# Patient Record
Sex: Female | Born: 1995 | Race: White | Hispanic: No | Marital: Single | Smoking: Former smoker
Health system: Southern US, Community
[De-identification: ages and names within clinical notes are randomized; demographics above are authoritative.]

## PROBLEM LIST (undated history)

## (undated) DIAGNOSIS — K219 Gastro-esophageal reflux disease without esophagitis: Secondary | ICD-10-CM

## (undated) DIAGNOSIS — Z23 Encounter for immunization: Secondary | ICD-10-CM

## (undated) DIAGNOSIS — T7840XA Allergy, unspecified, initial encounter: Secondary | ICD-10-CM

## (undated) DIAGNOSIS — IMO0001 Reserved for inherently not codable concepts without codable children: Secondary | ICD-10-CM

## (undated) DIAGNOSIS — N946 Dysmenorrhea, unspecified: Secondary | ICD-10-CM

## (undated) HISTORY — DX: Reserved for inherently not codable concepts without codable children: IMO0001

## (undated) HISTORY — PX: NO PAST SURGERIES: SHX2092

## (undated) HISTORY — DX: Dysmenorrhea, unspecified: N94.6

## (undated) HISTORY — DX: Encounter for immunization: Z23

## (undated) HISTORY — DX: Gastro-esophageal reflux disease without esophagitis: K21.9

## (undated) HISTORY — DX: Allergy, unspecified, initial encounter: T78.40XA

---
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
## 2016-07-01 ENCOUNTER — Encounter: Payer: Self-pay | Admitting: Physician Assistant

## 2016-07-01 ENCOUNTER — Ambulatory Visit (INDEPENDENT_AMBULATORY_CARE_PROVIDER_SITE_OTHER): Payer: BLUE CROSS/BLUE SHIELD | Admitting: Physician Assistant

## 2016-07-01 VITALS — BP 104/62 | HR 60 | Temp 97.3°F | Resp 16 | Wt 124.0 lb

## 2016-07-01 DIAGNOSIS — N3090 Cystitis, unspecified without hematuria: Secondary | ICD-10-CM | POA: Diagnosis not present

## 2016-07-01 LAB — POCT URINALYSIS DIPSTICK
Bilirubin, UA: NEGATIVE
Blood, UA: NEGATIVE
Glucose, UA: NEGATIVE
Ketones, UA: NEGATIVE
Leukocytes, UA: NEGATIVE
Urobilinogen, UA: 0.2 E.U./dL

## 2016-07-01 MED ORDER — CIPROFLOXACIN HCL 500 MG PO TABS: 500.0000 mg | ORAL_TABLET | Freq: Two times a day (BID) | ORAL | 0 refills | Status: AC

## 2016-07-01 NOTE — Progress Notes (Signed)
[name]  Chief Complaint  Patient presents with  . Urinary Tract Infection    Started last week    Subjective:    Patient ID: Melissa Barr, female    DOB: 01/26/1996, 21 y.o.   MRN: [id number]   Urinary Tract Infection: Patient with a history of pyelnonephritis complains of burning with urination, dysuria, frequency and nocturia She has had symptoms for 1 week. Patient also complains of back pain. Patient denies headache, stomach ache and vaginal discharge. Patient does have a history of recurrent UTI.  Patient does have a history of pyelonephritis or other renal issues. Patient denies vaginal discharge and denies new sexual partners. The patient denies recent travel outside of the [place]. Does have concerns of STI which she is getting evaluated at the health department tomorrow.  Review of Systems  Constitutional: Positive for malaise/fatigue. Negative for chills, diaphoresis, fever and weight loss.  Gastrointestinal: Negative.   Genitourinary: Positive for dysuria, flank pain, frequency and urgency. Negative for hematuria.  Neurological: Negative for dizziness, weakness and headaches.       Objective:   BP 104/62 (BP Location: Left Arm, Patient Position: Sitting, Cuff Size: Normal)   Pulse 60   Temp 97.3 F (36.3 C) (Oral)   Resp 16   Wt 124 lb (56.2 kg)   Patient Active Problem List   Diagnosis Date Noted  . Concussion 06/13/2015  . Implanon in place 12/14/2014    Outpatient Encounter Prescriptions as of 07/01/2016  Medication Sig Note  . escitalopram (LEXAPRO) 10 MG tablet Take 1/2 tab PO q h.s. X 1 week then 1 tab PO q h.s.   . etonogestrel (NEXPLANON) 68 MG IMPL implant 1 each by Subdermal route once.   Marland Kitchen ibuprofen (ADVIL,MOTRIN) 400 MG tablet Take 1 tablet (400 mg total) by mouth every 6 (six) hours as needed.   . methocarbamol (ROBAXIN) 500 MG tablet  06/13/2015: Received from: External Pharmacy  . triamcinolone  ointment (KENALOG) 0.1 % Reported on 05/08/2015 12/14/2014: Received from: External Pharmacy  . ciprofloxacin (CIPRO) 500 MG tablet Take 1 tablet (500 mg total) by mouth 2 (two) times daily.   . [DISCONTINUED] predniSONE (DELTASONE) 50 MG tablet  06/09/2015: Received from: External Pharmacy   No facility-administered encounter medications on file as of 07/01/2016.     Allergies  Allergen Reactions  . Sulfa Antibiotics Shortness Of Breath    rash       Physical Exam  Constitutional: She is oriented to person, place, and time. She appears well-developed and well-nourished. No distress.  Cardiovascular: Normal rate and regular rhythm.   Pulmonary/Chest: Effort normal and breath sounds normal.  Abdominal: Soft. Bowel sounds are normal. She exhibits no distension. There is tenderness in the suprapubic area. There is CVA tenderness. There is no rebound and no guarding.  Neurological: She is alert and oriented to person, place, and time.  Skin: Skin is warm and dry. She is not diaphoretic.  Psychiatric: She has a normal mood and affect. Her behavior is normal.       Assessment & Plan:  1. Cystitis   In office UA difficult to read 2/2 AZO tablets. S/sx consistent with cystitis, does have hx of pyelonephritis.   - ciprofloxacin (CIPRO) 500 MG tablet; Take 1 tablet (500 mg total) by mouth 2 (two) times daily.  Dispense: 14 tablet; Refill: 0  Return if symptoms worsen or fail to improve.  The entirety of the information documented in the History of  Present Illness, Review of Systems and Physical Exam were personally obtained by me. Portions of this information were initially documented by Kavin LeechLaura Walsh, CMA and reviewed by me for thoroughness and accuracy.    Patient Instructions  Urinary Tract Infection, Adult A urinary tract infection (UTI) is an infection of any part of the urinary tract, which includes the kidneys, ureters, bladder, and urethra. These organs make, store, and get rid of  urine in the body. UTI can be a bladder infection (cystitis) or kidney infection (pyelonephritis). What are the causes? This infection may be caused by fungi, viruses, or bacteria. Bacteria are the most common cause of UTIs. This condition can also be caused by repeated incomplete emptying of the bladder during urination. What increases the risk? This condition is more likely to develop if:  You ignore your need to urinate or hold urine for long periods of time.  You do not empty your bladder completely during urination.  You wipe back to front after urinating or having a bowel movement, if you are female.  You are uncircumcised, if you are female.  You are constipated.  You have a urinary catheter that stays in place (indwelling).  You have a weak defense (immune) system.  You have a medical condition that affects your bowels, kidneys, or bladder.  You have diabetes.  You take antibiotic medicines frequently or for long periods of time, and the antibiotics no longer work well against certain types of infections (antibiotic resistance).  You take medicines that irritate your urinary tract.  You are exposed to chemicals that irritate your urinary tract.  You are female. What are the signs or symptoms? Symptoms of this condition include:  Fever.  Frequent urination or passing small amounts of urine frequently.  Needing to urinate urgently.  Pain or burning with urination.  Urine that smells bad or unusual.  Cloudy urine.  Pain in the lower abdomen or back.  Trouble urinating.  Blood in the urine.  Vomiting or being less hungry than normal.  Diarrhea or abdominal pain.  Vaginal discharge, if you are female. How is this diagnosed? This condition is diagnosed with a medical history and physical exam. You will also need to provide a urine sample to test your urine. Other tests may be done, including:  Blood tests.  Sexually transmitted disease (STD) testing. If  you have had more than one UTI, a cystoscopy or imaging studies may be done to determine the cause of the infections. How is this treated? Treatment for this condition often includes a combination of two or more of the following:  Antibiotic medicine.  Other medicines to treat less common causes of UTI.  Over-the-counter medicines to treat pain.  Drinking enough water to stay hydrated. Follow these instructions at home:  Take over-the-counter and prescription medicines only as told by your health care provider.  If you were prescribed an antibiotic, take it as told by your health care provider. Do not stop taking the antibiotic even if you start to feel better.  Avoid alcohol, caffeine, tea, and carbonated beverages. They can irritate your bladder.  Drink enough fluid to keep your urine clear or pale yellow.  Keep all follow-up visits as told by your health care provider. This is important.  Make sure to:  Empty your bladder often and completely. Do not hold urine for long periods of time.  Empty your bladder before and after sex.  Wipe from front to back after a bowel movement if you are female.  Use each tissue one time when you wipe. Contact a health care provider if:  You have back pain.  You have a fever.  You feel nauseous or vomit.  Your symptoms do not get better after 3 days.  Your symptoms go away and then return. Get help right away if:  You have severe back pain or lower abdominal pain.  You are vomiting and cannot keep down any medicines or water. This information is not intended to replace advice given to you by your health care provider. Make sure you discuss any questions you have with your health care provider. Document Released: 11/14/2004 Document Revised: 07/19/2015 Document Reviewed: 12/26/2014 Elsevier Interactive Patient Education  2017 [employer].

## 2016-07-01 NOTE — Patient Instructions (Signed)

## 2016-07-01 NOTE — Addendum Note (Signed)
Addended by: Kavin LeechWALSH, Afsa Meany E on: 07/01/2016 04:43 PM   Modules accepted: Orders

## 2016-07-03 LAB — URINE CULTURE

## 2016-07-03 LAB — PLEASE NOTE

## 2016-07-04 ENCOUNTER — Telehealth: Payer: Self-pay

## 2016-07-04 NOTE — Telephone Encounter (Signed)
Advised pt of lab results. Pt verbally acknowledges understanding. Emily Drozdowski, CMA   

## 2016-07-04 NOTE — Telephone Encounter (Signed)
-----   Message from Trey SailorsAdriana M Pollak, [place]PA-C sent at 07/03/2016  3:35 PM EDT ----- Urine culture is positive for E. Coli sensitive to ciprofloxacin. Hopefully pt starting to feel better.

## 2016-09-26 ENCOUNTER — Ambulatory Visit: Payer: BLUE CROSS/BLUE SHIELD | Admitting: Physician Assistant

## 2016-09-27 ENCOUNTER — Encounter: Payer: Self-pay | Admitting: Physician Assistant

## 2016-09-27 ENCOUNTER — Ambulatory Visit (INDEPENDENT_AMBULATORY_CARE_PROVIDER_SITE_OTHER): Payer: BLUE CROSS/BLUE SHIELD | Admitting: Physician Assistant

## 2016-09-27 VITALS — BP 102/64 | HR 92 | Temp 98.6°F | Resp 16 | Wt 127.0 lb

## 2016-09-27 DIAGNOSIS — J302 Other seasonal allergic rhinitis: Secondary | ICD-10-CM | POA: Diagnosis not present

## 2016-09-27 DIAGNOSIS — R062 Wheezing: Secondary | ICD-10-CM

## 2016-09-27 DIAGNOSIS — R0981 Nasal congestion: Secondary | ICD-10-CM | POA: Diagnosis not present

## 2016-09-27 MED ORDER — BENZONATATE 100 MG PO CAPS: 100.0000 mg | ORAL_CAPSULE | Freq: Three times a day (TID) | ORAL | 0 refills | Status: AC | PRN

## 2016-09-27 MED ORDER — ALBUTEROL SULFATE HFA 108 (90 BASE) MCG/ACT IN AERS: 2.0000 | INHALATION_SPRAY | Freq: Four times a day (QID) | RESPIRATORY_TRACT | 2 refills | Status: DC | PRN

## 2016-09-27 MED ORDER — FLUTICASONE PROPIONATE 50 MCG/ACT NA SUSP: 2.0000 | Freq: Every day | NASAL | 6 refills | Status: DC

## 2016-09-27 NOTE — Patient Instructions (Signed)
Allergic Rhinitis Allergic rhinitis is when the mucous membranes in the nose respond to allergens. Allergens are particles in the air that cause your body to have an allergic reaction. This causes you to release allergic antibodies. Through a chain of events, these eventually cause you to release histamine into the blood stream. Although meant to protect the body, it is this release of histamine that causes your discomfort, such as frequent sneezing, congestion, and an itchy, runny nose. What are the causes? Seasonal allergic rhinitis (hay fever) is caused by pollen allergens that may come from grasses, trees, and weeds. Year-round allergic rhinitis (perennial allergic rhinitis) is caused by allergens such as house dust mites, pet dander, and mold spores. What are the signs or symptoms?  Nasal stuffiness (congestion).  Itchy, runny nose with sneezing and tearing of the eyes. How is this diagnosed? Your health care provider can help you determine the allergen or allergens that trigger your symptoms. If you and your health care provider are unable to determine the allergen, skin or blood testing may be used. Your health care provider will diagnose your condition after taking your health history and performing a physical exam. Your health care provider may assess you for other related conditions, such as asthma, pink eye, or an ear infection. How is this treated? Allergic rhinitis does not have a cure, but it can be controlled by:  Medicines that block allergy symptoms. These may include allergy shots, nasal sprays, and oral antihistamines.  Avoiding the allergen. Hay fever may often be treated with antihistamines in pill or nasal spray forms. Antihistamines block the effects of histamine. There are over-the-counter medicines that may help with nasal congestion and swelling around the eyes. Check with your health care provider before taking or giving this medicine. If avoiding the allergen or the  medicine prescribed do not work, there are many new medicines your health care provider can prescribe. Stronger medicine may be used if initial measures are ineffective. Desensitizing injections can be used if medicine and avoidance does not work. Desensitization is when a patient is given ongoing shots until the body becomes less sensitive to the allergen. Make sure you follow up with your health care provider if problems continue. Follow these instructions at home: It is not possible to completely avoid allergens, but you can reduce your symptoms by taking steps to limit your exposure to them. It helps to know exactly what you are allergic to so that you can avoid your specific triggers. Contact a health care provider if:  You have a fever.  You develop a cough that does not stop easily (persistent).  You have shortness of breath.  You start wheezing.  Symptoms interfere with normal daily activities. This information is not intended to replace advice given to you by your health care provider. Make sure you discuss any questions you have with your health care provider. Document Released: 10/30/2000 Document Revised: 10/06/2015 Document Reviewed: 10/12/2012 Elsevier Interactive Patient Education  2017 Elsevier Inc.  

## 2016-09-27 NOTE — Progress Notes (Signed)
Patient: Melissa Barr Female    DOB: [birth year]   21 y.o.   MRN: [id number] Visit Date: 09/27/2016  Today's Provider: Trey Sailors, PA-C   Chief Complaint  Patient presents with  . Cough    Started in June   Subjective:    Cough  This is a chronic problem. The current episode started more than 1 month ago. The problem has been gradually worsening. The cough is productive of sputum. Associated symptoms include rhinorrhea and wheezing. Pertinent negatives include no ear pain, eye redness, headaches, postnasal drip, sore throat or shortness of breath. Nothing aggravates the symptoms. Risk factors for lung disease include smoking/tobacco exposure. She has tried OTC cough suppressant (Mucinex, Alleragra) for the symptoms. The treatment provided no relief. Her past medical history is significant for environmental allergies.       Allergies  Allergen Reactions  . Sulfa Antibiotics Shortness Of Breath    rash     Current Outpatient Prescriptions:  .  escitalopram (LEXAPRO) 10 MG tablet, Take 1/2 tab PO q h.s. X 1 week then 1 tab PO q h.s., Disp: 30 tablet, Rfl: 1 .  etonogestrel (NEXPLANON) 68 MG IMPL implant, 1 each by Subdermal route once., Disp: , Rfl:  .  ibuprofen (ADVIL,MOTRIN) 400 MG tablet, Take 1 tablet (400 mg total) by mouth every 6 (six) hours as needed., Disp: 30 tablet, Rfl: 0 .  triamcinolone ointment (KENALOG) 0.1 %, Reported on 05/08/2015, Disp: , Rfl:   Review of Systems  Constitutional: Negative.   HENT: Positive for congestion, rhinorrhea, tinnitus and voice change. Negative for ear discharge, ear pain, hearing loss, nosebleeds, postnasal drip, sinus pain, sinus pressure, sneezing, sore throat and trouble swallowing.   Eyes: Positive for discharge. Negative for photophobia, pain, redness, itching and visual disturbance.  Respiratory: Positive for cough and wheezing. Negative for apnea, choking, chest tightness, shortness of breath and stridor.     Gastrointestinal: Negative for abdominal distention, abdominal pain, anal bleeding, blood in stool, constipation, diarrhea, nausea, rectal pain and vomiting.  Allergic/Immunologic: Positive for environmental allergies.  Neurological: Positive for dizziness. Negative for light-headedness and headaches.    Social History  Substance Use Topics  . Smoking status: Former Smoker    Years: 1.00    Types: Cigarettes    Quit date: 04/18/2016  . Smokeless tobacco: Never Used  . Alcohol use Yes     Comment: Ocassional    Objective:   BP 102/64 (BP Location: Right Arm, Patient Position: Sitting, Cuff Size: Normal)   Pulse 92   Temp 98.6 F (37 C) (Oral)   Resp 16   Wt 127 lb (57.6 kg)   SpO2 98%  Vitals:   09/27/16 1440  BP: 102/64  Pulse: 92  Resp: 16  Temp: 98.6 F (37 C)  TempSrc: Oral  SpO2: 98%  Weight: 127 lb (57.6 kg)     Physical Exam  Constitutional: She appears well-developed and well-nourished.  HENT:  Right Ear: Tympanic membrane and external ear normal.  Left Ear: Tympanic membrane and external ear normal.  Nose: Right sinus exhibits no maxillary sinus tenderness and no frontal sinus tenderness. Left sinus exhibits no maxillary sinus tenderness and no frontal sinus tenderness.  Mouth/Throat: Oropharynx is clear and moist. No oropharyngeal exudate.  Sounds like patient has a lot of nasal congestion.  Eyes: Conjunctivae are normal.  Neck: Neck supple.  Cardiovascular: Normal rate and regular rhythm.   Pulmonary/Chest: Effort normal and breath sounds normal.  Lymphadenopathy:    She has no cervical adenopathy.  Skin: Skin is warm and dry.  Psychiatric: She has a normal mood and affect. Her behavior is normal.        Assessment & Plan:     1. Nasal congestion  - fluticasone (FLONASE) 50 MCG/ACT nasal spray; Place 2 sprays into both nostrils daily.  Dispense: 16 g; Refill: 6 - benzonatate (TESSALON PERLES) 100 MG capsule; Take 1 capsule (100 mg total) by mouth  3 (three) times daily as needed for cough.  Dispense: 21 capsule; Refill: 0  2. Wheezing  - albuterol (PROVENTIL HFA;VENTOLIN HFA) 108 (90 Base) MCG/ACT inhaler; Inhale 2 puffs into the lungs every 6 (six) hours as needed for wheezing or shortness of breath.  Dispense: 1 Inhaler; Refill: 2  3. Seasonal allergic rhinitis, unspecified trigger  Takes allegra daily.  No Follow-up on file.  The entirety of the information documented in the History of Present Illness, Review of Systems and Physical Exam were personally obtained by me. Portions of this information were initially documented by Kavin LeechLaura Walsh, CMA and reviewed by me for thoroughness and accuracy.         Trey SailorsAdriana M Pollak, PA-C  [facility]

## 2016-12-26 ENCOUNTER — Ambulatory Visit (INDEPENDENT_AMBULATORY_CARE_PROVIDER_SITE_OTHER): Payer: BLUE CROSS/BLUE SHIELD | Admitting: Physician Assistant

## 2016-12-26 ENCOUNTER — Encounter: Payer: Self-pay | Admitting: Physician Assistant

## 2016-12-26 VITALS — BP 110/60 | HR 100 | Temp 98.2°F | Resp 16 | Ht 62.0 in | Wt 124.0 lb

## 2016-12-26 DIAGNOSIS — N3090 Cystitis, unspecified without hematuria: Secondary | ICD-10-CM | POA: Diagnosis not present

## 2016-12-26 DIAGNOSIS — R197 Diarrhea, unspecified: Secondary | ICD-10-CM | POA: Diagnosis not present

## 2016-12-26 DIAGNOSIS — R309 Painful micturition, unspecified: Secondary | ICD-10-CM | POA: Diagnosis not present

## 2016-12-26 LAB — POCT URINALYSIS DIPSTICK
BILIRUBIN UA: NEGATIVE
GLUCOSE UA: NEGATIVE
KETONES UA: NEGATIVE
NITRITE UA: NEGATIVE
Protein, UA: NEGATIVE
Spec Grav, UA: 1.015 (ref 1.010–1.025)
Urobilinogen, UA: 0.2 E.U./dL
pH, UA: 6 (ref 5.0–8.0)

## 2016-12-26 MED ORDER — CIPROFLOXACIN HCL 500 MG PO TABS: 500.0000 mg | ORAL_TABLET | Freq: Two times a day (BID) | ORAL | 0 refills | Status: DC

## 2016-12-26 NOTE — Patient Instructions (Signed)
Food Choices to Help Relieve Diarrhea, Adult When you have diarrhea, the foods you eat and your eating habits are very important. Choosing the right foods and drinks can help:  Relieve diarrhea.  Replace lost fluids and nutrients.  Prevent dehydration.  What general guidelines should I follow? Relieving diarrhea  Choose foods with less than 2 g or .07 oz. of fiber per serving.  Limit fats to less than 8 tsp (38 g or 1.34 oz.) a day.  Avoid the following: ? Foods and beverages sweetened with high-fructose corn syrup, honey, or sugar alcohols such as xylitol, sorbitol, and mannitol. ? Foods that contain a lot of fat or sugar. ? Fried, greasy, or spicy foods. ? High-fiber grains, breads, and cereals. ? Raw fruits and vegetables.  Eat foods that are rich in probiotics. These foods include dairy products such as yogurt and fermented milk products. They help increase healthy bacteria in the stomach and intestines (gastrointestinal tract, or GI tract).  If you have lactose intolerance, avoid dairy products. These may make your diarrhea worse.  Take medicine to help stop diarrhea (antidiarrheal medicine) only as told by your health care provider. Replacing nutrients  Eat small meals or snacks every 3-4 hours.  Eat bland foods, such as white rice, toast, or baked potato, until your diarrhea starts to get better. Gradually reintroduce nutrient-rich foods as tolerated or as told by your health care provider. This includes: ? Well-cooked protein foods. ? Peeled, seeded, and soft-cooked fruits and vegetables. ? Low-fat dairy products.  Take vitamin and mineral supplements as told by your health care provider. Preventing dehydration   Start by sipping water or a special solution to prevent dehydration (oral rehydration solution, ORS). Urine that is clear or pale yellow means that you are getting enough fluid.  Try to drink at least 8-10 cups of fluid each day to help replace lost  fluids.  You may add other liquids in addition to water, such as clear juice or decaffeinated sports drinks, as tolerated or as told by your health care provider.  Avoid drinks with caffeine, such as coffee, tea, or soft drinks.  Avoid alcohol. What foods are recommended? The items listed may not be a complete list. Talk with your health care provider about what dietary choices are best for you. Grains White rice. White, French, or pita breads (fresh or toasted), including plain rolls, buns, or bagels. White pasta. Saltine, soda, or graham crackers. Pretzels. Low-fiber cereal. Cooked cereals made with water (such as cornmeal, farina, or cream cereals). Plain muffins. Matzo. Melba toast. Zwieback. Vegetables Potatoes (without the skin). Most well-cooked and canned vegetables without skins or seeds. Tender lettuce. Fruits Apple sauce. Fruits canned in juice. Cooked apricots, cherries, grapefruit, peaches, pears, or plums. Fresh bananas and cantaloupe. Meats and other protein foods Baked or boiled chicken. Eggs. Tofu. Fish. Seafood. Smooth nut butters. Ground or well-cooked tender beef, ham, veal, lamb, pork, or poultry. Dairy Plain yogurt, kefir, and unsweetened liquid yogurt. Lactose-free milk, buttermilk, skim milk, or soy milk. Low-fat or nonfat hard cheese. Beverages Water. Low-calorie sports drinks. Fruit juices without pulp. Strained tomato and vegetable juices. Decaffeinated teas. Sugar-free beverages not sweetened with sugar alcohols. Oral rehydration solutions, if approved by your health care provider. Seasoning and other foods Bouillon, broth, or soups made from recommended foods. What foods are not recommended? The items listed may not be a complete list. Talk with your health care provider about what dietary choices are best for you. Grains Whole grain, whole wheat,   bran, or rye breads, rolls, pastas, and crackers. Wild or brown rice. Whole grain or bran cereals. Barley. Oats and  oatmeal. Corn tortillas or taco shells. Granola. Popcorn. Vegetables Raw vegetables. Fried vegetables. Cabbage, broccoli, Brussels sprouts, artichokes, baked beans, beet greens, corn, kale, legumes, peas, sweet potatoes, and yams. Potato skins. Cooked spinach and cabbage. Fruits Dried fruit, including raisins and dates. Raw fruits. Stewed or dried prunes. Canned fruits with syrup. Meat and other protein foods Fried or fatty meats. Deli meats. Chunky nut butters. Nuts and seeds. Beans and lentils. Tomasa BlaseBacon. Hot dogs. Sausage. Dairy High-fat cheeses. Whole milk, chocolate milk, and beverages made with milk, such as milk shakes. Half-and-half. Cream. sour cream. Ice cream. Beverages Caffeinated beverages (such as coffee, tea, soda, or energy drinks). Alcoholic beverages. Fruit juices with pulp. Prune juice. Soft drinks sweetened with high-fructose corn syrup or sugar alcohols. High-calorie sports drinks. Fats and oils Butter. Cream sauces. Margarine. Salad oils. Plain salad dressings. Olives. Avocados. Mayonnaise. Sweets and desserts Sweet rolls, doughnuts, and sweet breads. Sugar-free desserts sweetened with sugar alcohols such as xylitol and sorbitol. Seasoning and other foods Honey. Hot sauce. Chili powder. Gravy. Cream-based or milk-based soups. Pancakes and waffles. Summary  When you have diarrhea, the foods you eat and your eating habits are very important.  Make sure you get at least 8-10 cups of fluid each day, or enough to keep your urine clear or pale yellow.  Eat bland foods and gradually reintroduce healthy, nutrient-rich foods as tolerated, or as told by your health care provider.  Avoid high-fiber, fried, greasy, or spicy foods. This information is not intended to replace advice given to you by your health care provider. Make sure you discuss any questions you have with your health care provider. Document Released: 04/27/2003 Document Revised: 02/02/2016 Document Reviewed:  02/02/2016 Elsevier Interactive Patient Education  2017 Elsevier Inc. Urinary Tract Infection, Adult A urinary tract infection (UTI) is an infection of any part of the urinary tract. The urinary tract includes the:  Kidneys.  Ureters.  Bladder.  Urethra.  These organs make, store, and get rid of pee (urine) in the body. Follow these instructions at home:  Take over-the-counter and prescription medicines only as told by your doctor.  If you were prescribed an antibiotic medicine, take it as told by your doctor. Do not stop taking the antibiotic even if you start to feel better.  Avoid the following drinks: ? Alcohol. ? Caffeine. ? Tea. ? Carbonated drinks.  Drink enough fluid to keep your pee clear or pale yellow.  Keep all follow-up visits as told by your doctor. This is important.  Make sure to: ? Empty your bladder often and completely. Do not to hold pee for long periods of time. ? Empty your bladder before and after sex. ? Wipe from front to back after a bowel movement if you are female. Use each tissue one time when you wipe. Contact a doctor if:  You have back pain.  You have a fever.  You feel sick to your stomach (nauseous).  You throw up (vomit).  Your symptoms do not get better after 3 days.  Your symptoms go away and then come back. Get help right away if:  You have very bad back pain.  You have very bad lower belly (abdominal) pain.  You are throwing up and cannot keep down any medicines or water. This information is not intended to replace advice given to you by your health care provider. Make sure you discuss  any questions you have with your health care provider. Document Released: 07/24/2007 Document Revised: 07/13/2015 Document Reviewed: 12/26/2014 Elsevier Interactive Patient Education  [employer]2018 Elsevier Inc.

## 2016-12-26 NOTE — Progress Notes (Signed)
Patient: Melissa Barr Female    DOB: Feb 26, 1995   21 y.o.   MRN: [id number] Visit Date: 12/26/2016  Today's Provider: Margaretann LovelessJennifer M Laurajean Hosek, PA-C   Chief Complaint  Patient presents with  . Urinary Tract Infection   Subjective:    HPI Urinary Tract Infection: Patient complains of nausea and pain in the epigastrium She has had symptoms for 1 day. Patient also complains of back pain. Patient denies fever. Patient does have a history of recurrent UTI.  Patient does have a history of pyelonephritis. Patient reports that she has been sick for several weeks with a viral stomach infection. Patient reports having abdominal pain, nausea and diarrhea for several weeks. Patient reports that she had black watery stools for several days approx 3 weeks ago. Stools then returned to normal and now is having loose yellow stools that started yesterday. Patient reports that she did take OTC Pepcid and Pepto to help with pain and diarrhea.  Patient reports she did start her period 2 days ago. Patient reports not taking any medications for pain.   Patient C/O of knot on left wrist x's one month. Patient reports pain on left wrist. Patient reports not taking any medications for wrist pain.     Allergies  Allergen Reactions  . Sulfa Antibiotics Shortness Of Breath    rash     Current Outpatient Medications:  .  albuterol (PROVENTIL HFA;VENTOLIN HFA) 108 (90 Base) MCG/ACT inhaler, Inhale 2 puffs into the lungs every 6 (six) hours as needed for wheezing or shortness of breath., Disp: 1 Inhaler, Rfl: 2 .  etonogestrel (NEXPLANON) 68 MG IMPL implant, 1 each by Subdermal route once., Disp: , Rfl:  .  ibuprofen (ADVIL,MOTRIN) 400 MG tablet, Take 1 tablet (400 mg total) by mouth every 6 (six) hours as needed., Disp: 30 tablet, Rfl: 0  Review of Systems  Constitutional: Negative.   Respiratory: Negative.   Cardiovascular: Negative.   Gastrointestinal: Positive for abdominal distention, abdominal pain,  diarrhea and nausea. Negative for anal bleeding, blood in stool, constipation and rectal pain.  Genitourinary: Positive for dysuria, flank pain, frequency and vaginal bleeding (on menstrual). Negative for hematuria, menstrual problem, pelvic pain, vaginal discharge and vaginal pain.  Musculoskeletal: Positive for back pain.  Neurological: Negative.     Social History   Tobacco Use  . Smoking status: Former Smoker    Years: 1.00    Types: Cigarettes    Last attempt to quit: 04/18/2016    Years since quitting: 0.6  . Smokeless tobacco: Never Used  Substance Use Topics  . Alcohol use: Yes    Comment: Ocassional    Objective:   BP 110/60 (BP Location: Left Arm, Patient Position: Sitting, Cuff Size: Normal)   Pulse 100   Temp 98.2 F (36.8 C) (Oral)   Resp 16   Ht 5\' 2"  (1.575 m)   Wt 124 lb (56.2 kg)   BMI 22.68 kg/m  Vitals:   12/26/16 1129  BP: 110/60  Pulse: 100  Resp: 16  Temp: 98.2 F (36.8 C)  TempSrc: Oral  Weight: 124 lb (56.2 kg)  Height: 5\' 2"  (1.575 m)     Physical Exam  Constitutional: She is oriented to person, place, and time. She appears well-developed and well-nourished. No distress.  Cardiovascular: Normal rate, regular rhythm and normal heart sounds. Exam reveals no gallop and no friction rub.  No murmur heard. Pulmonary/Chest: Effort normal and breath sounds normal. No respiratory distress. She has  no wheezes. She has no rales.  Abdominal: Soft. Normal appearance and bowel sounds are normal. She exhibits no distension and no mass. There is no hepatosplenomegaly. There is generalized tenderness. There is no rebound, no guarding and no CVA tenderness.  Neurological: She is alert and oriented to person, place, and time.  Skin: Skin is warm and dry. She is not diaphoretic.       Assessment & Plan:     1. Cystitis Worsening symptoms. UA positive. Will treat empirically with Cipro. Continue to push fluids. Urine sent for culture. Will follow up pending  C&S results. She is to call if symptoms do not improve or if they worsen.  - Urine Culture - ciprofloxacin (CIPRO) 500 MG tablet; Take 1 tablet (500 mg total) 2 (two) times daily by mouth.  Dispense: 10 tablet; Refill: 0  2. Painful urination See above medical treatment plan. - POCT urinalysis dipstick  3. Diarrhea, unspecified type Recurrent. Only happens once daily, but not every day. Was given information for food choices to lessen diarrhea. May use imodium if it becomes more frequent. Call if symptoms worsen and will evaluate further.        Margaretann LovelessJennifer M Keandrea Tapley, PA-C  [facility]

## 2016-12-28 LAB — URINE CULTURE
MICRO NUMBER:: 81258436
SPECIMEN QUALITY:: ADEQUATE

## 2016-12-30 ENCOUNTER — Telehealth: Payer: Self-pay

## 2016-12-30 NOTE — Telephone Encounter (Signed)
Patient advised as directed below.  Thanks,  -Joseline 

## 2016-12-30 NOTE — Telephone Encounter (Signed)
-----   Message from Margaretann LovelessJennifer M Burnette, PA-C sent at 12/30/2016 11:06 AM EST ----- Urine culture positive for e. Coli bacteria. It is susceptible to cipro so take until completed and call if symptoms worsen.

## 2017-03-11 ENCOUNTER — Ambulatory Visit (INDEPENDENT_AMBULATORY_CARE_PROVIDER_SITE_OTHER): Payer: BLUE CROSS/BLUE SHIELD | Admitting: Certified Nurse Midwife

## 2017-03-11 ENCOUNTER — Encounter: Payer: Self-pay | Admitting: Certified Nurse Midwife

## 2017-03-11 VITALS — BP 100/60 | HR 78 | Ht 62.0 in | Wt 132.0 lb

## 2017-03-11 DIAGNOSIS — Z3046 Encounter for surveillance of implantable subdermal contraceptive: Secondary | ICD-10-CM

## 2017-03-11 DIAGNOSIS — Z30016 Encounter for initial prescription of transdermal patch hormonal contraceptive device: Secondary | ICD-10-CM | POA: Diagnosis not present

## 2017-03-11 DIAGNOSIS — Z01419 Encounter for gynecological examination (general) (routine) without abnormal findings: Secondary | ICD-10-CM

## 2017-03-11 DIAGNOSIS — Z113 Encounter for screening for infections with a predominantly sexual mode of transmission: Secondary | ICD-10-CM | POA: Diagnosis not present

## 2017-03-11 MED ORDER — NORELGESTROMIN-ETH ESTRADIOL 150-35 MCG/24HR TD PTWK: MEDICATED_PATCH | TRANSDERMAL | 12 refills | Status: DC

## 2017-03-11 NOTE — Progress Notes (Signed)
Gynecology Annual Exam  PCP: [name], PA-C  Chief Complaint:  Chief Complaint  Patient presents with  . Gynecologic Exam    nexplanon removal    History of Present Illness:Melissa Barr presents today for her annual exam. She is a 22 year old Caucasian/White female, G0 P0000, whose LMP was 02/24/2017. She is also wanting her expiring Nexplanon removed today.  Her menses are regular. They occur every every month. Some last 5-7 days and others last 2+weeks, are light to moderate flow, and are without clots.  She has  Dysmenorrhea when her menses first start. She takes Midol with relief. She is debating whether to have another Nexplanon inserted or to switch methods because of her current bleeding pattern and because of her 18# weight gain since it's insertion.   The patient's past medical history is non-contributory. She has completed her Gardasil series.  Since her last annual GYN exam 10/03/2015, she has had no significant changes in her health history.  She is sexually active and reports no problems with intercourse. She is currently using Nexplanon for contraception. It was inserted 04/18/2014.  Her most recent Pap smear: NA due to age.  There is no family history of breast cancer The patient does do occ self breast exams.  The patient had stopped smoking in 2018 and restarted smoking 2 mos ago: 3 cigarettes/day. The patient does drink infrequently.  The patient does exercise occasionally. She has not had cholesterol screening and is not interested in testing.   Review of Systems: Review of Systems  Constitutional: Negative for chills, fever and weight loss.  HENT: Negative for congestion, sinus pain and sore throat.   Eyes: Negative for blurred vision and pain.  Respiratory: Negative for hemoptysis, shortness of breath and wheezing.   Cardiovascular: Negative for chest pain, palpitations and leg swelling.  Gastrointestinal: Negative for abdominal pain, blood in  stool, diarrhea, heartburn, nausea and vomiting.  Genitourinary: Negative for dysuria, frequency, hematuria and urgency.       Positive for prolonged bleeding and dysmenorrhea  Musculoskeletal: Negative for back pain, joint pain and myalgias.  Skin: Negative for itching and rash.  Neurological: Negative for dizziness, tingling and headaches.  Endo/Heme/Allergies: Negative for environmental allergies and polydipsia. Does not bruise/bleed easily.       Negative for hirsutism   Psychiatric/Behavioral: Negative for depression. The patient is not nervous/anxious and does not have insomnia.     Past Medical History:  Past Medical History:  Diagnosis Date  . Allergy   . Dysmenorrhea   . Esophageal reflux   . Human papilloma virus (HPV) type 9 vaccine administered    gardisil series complete    Past Surgical History:  Past Surgical History:  Procedure Laterality Date  . NO PAST SURGERIES      Family History:  Family History  Problem Relation Age of Onset  . Thyroid cancer Mother   . Hyperlipidemia Father   . Heart disease Maternal Grandmother   . Hypertension Maternal Grandmother   . Bladder Cancer Maternal Grandfather 42  . Heart disease Maternal Grandfather   . Hypertension Maternal Grandfather   . Heart disease Paternal Grandfather   . Breast cancer Maternal Aunt 35  . Colon cancer Maternal Uncle 50  . Uterine cancer Other 69    Social History:  Social History   Socioeconomic History  . Marital status: Single    Spouse name: Not on file  . Number of children: 0  . Years of  education: Not on file  . Highest education level: Not on file  Social Needs  . Financial resource strain: Not on file  . Food insecurity - worry: Not on file  . Food insecurity - inability: Not on file  . Transportation needs - medical: Not on file  . Transportation needs - non-medical: Not on file  Occupational History  . Not on file  Tobacco Use  . Smoking status: Light Tobacco Smoker     Packs/day: 0.10    Years: 1.50    Pack years: 0.15    Types: Cigarettes    Last attempt to quit: 04/18/2016    Years since quitting: 0.8  . Smokeless tobacco: Never Used  . Tobacco comment: had stopped smoking 04/18/2016 after 1 yr and restarted 12/2016  Substance and Sexual Activity  . Alcohol use: Yes    Comment: Ocassional   . Drug use: No  . Sexual activity: Yes    Partners: Male    Birth control/protection: Implant  Other Topics Concern  . Not on file  Social History Narrative  . Not on file    Allergies:  Allergies  Allergen Reactions  . Sulfa Antibiotics Shortness Of Breath    rash    Medications:  Current Outpatient Medications on File Prior to Visit  Medication Sig Dispense Refill  . etonogestrel (NEXPLANON) 68 MG IMPL implant 1 each by Subdermal route once.     No current facility-administered medications on file prior to visit.   Physical Exam Vitals: BP 100/60   Pulse 78   Ht 5\' 2"  (1.575 m)   Wt 132 lb (59.9 kg)   LMP 02/24/2017 (Approximate)   BMI 24.14 kg/m   General: WF in NAD HEENT: normocephalic, anicteric Neck: no thyroid enlargement, no palpable nodules, no cervical lymphadenopathy  Pulmonary: No increased work of breathing, CTAB Cardiovascular: RRR, without murmur  Breast: Breast symmetrical, no tenderness, no palpable nodules or masses, no skin or nipple retraction present, no nipple discharge.  No axillary, infraclavicular or supraclavicular lymphadenopathy. Abdomen: Soft, non-tender, non-distended.  Umbilicus without lesions.  No hepatomegaly or masses palpable. No evidence of hernia. Genitourinary:  External: Normal external female genitalia.  Normal urethral meatus, normal Bartholin's and Skene's glands.    Vagina: Normal vaginal mucosa, no evidence of prolapse.    Cervix: Grossly normal in appearance, no bleeding, non-tender  Uterus: normal size, shape, and consistency, mobile, and non-tender  Adnexa: No adnexal masses,  non-tender  Rectal: deferred  Lymphatic: no evidence of inguinal lymphadenopathy Extremities: no edema, erythema, or tenderness. Nexplanon palpated in left upper arm. Neurologic: Grossly intact Psychiatric: mood appropriate, affect full     Assessment: 22 y.o. annual gyn exam Tobacco use Contraceptive management  Plan:   1) Breast cancer screening - recommend monthly self breast exam.   2) STI screening was offered and accepted.  3) Cervical cancer screening - Pap not indicated. ASCCP guidelines and rational discussed. Start Pap smears in 1 year  4) Contraception - Education given regarding options for contraception after Nexplanon removed. Discussed inserting another Nexplanon vs using the patch or Nuvaring, OCPs. Patient desires to try contraceptive patch. We reviewed how to apply, changing weekly x 3 weeks then going patchless in the 4th week. Explained possible side effects and risks of thromboembolic disease. Follow up in 2-3 months. Patient applied first patch prior to leaving office. Rx sent to pharmacy. Advised to use back up birth control (condoms) x 1 week.     GYNECOLOGY PROCEDURE NOTE  Nexplanon  removal discussed in detail.  Risks of infection, bleeding, nerve injury all reviewed.  Patient understands risks and desires to proceed.  Verbal consent obtained.  Patient is certain she wants the Nexplanon  removed.  All questions answered.  Procedure: Patient placed in dorsal supine with left arm above head, elbow flexed at 90 degrees, arm resting on examination table.  Nexplanon identified without problems.  Betadine scrub x3.  1 ml of 1% lidocaine injected under distal end of Nexplanon device without problems.  Sterile gloves applied.  Small 0.5cm incision made at distal tip of  device with 11 blade scalpel.  The implant was  brought to incision and grasped with a small kelly clamp. It was removed intact without problems.  Pressure applied to incision.  Hemostasis obtained.   Steri-strips applied, followed by bandage and compression dressing.  Patient tolerated procedure well.  No complications.   Assessment: 22 y.o. year old female now s/p uncomplicated implanon removal.  Plan: 1.  Patient given post procedure precautions and asked to call for fever, chills, redness or drainage from her incision, bleeding from incision.  She understands she will likely have a small bruise near site of removal and can remove bandage tomorrow and steri-strips in approximately 5 days.  2) Contraception: Woodroe Chen, CNM

## 2017-03-12 ENCOUNTER — Encounter: Payer: Self-pay | Admitting: Certified Nurse Midwife

## 2017-03-13 LAB — CHLAMYDIA/GONOCOCCUS/TRICHOMONAS, NAA
Chlamydia by NAA: NEGATIVE
Gonococcus by NAA: NEGATIVE
Trich vag by NAA: NEGATIVE

## 2017-03-28 ENCOUNTER — Telehealth: Payer: Self-pay

## 2017-03-28 NOTE — Telephone Encounter (Signed)
Pt calling to see what to do about her bc patch now, she is done c them, one of them slid off of her.  Does she go to the pharm and get more?  [phone]

## 2017-04-01 NOTE — Telephone Encounter (Signed)
Pt stated today was day 7 with 3rd patch. Plans to leave patch off and start new patch on Sunday. Pt c/o bleeding and spotting irregularly. Advised normal with removal of nexplanon and expect up to 3 months before cycle becomes regular with the patch. Also advised to used back up methods of contraception. Pt aware and appreciative.

## 2017-04-09 ENCOUNTER — Ambulatory Visit: Payer: BLUE CROSS/BLUE SHIELD | Admitting: Physician Assistant

## 2017-04-17 ENCOUNTER — Ambulatory Visit (INDEPENDENT_AMBULATORY_CARE_PROVIDER_SITE_OTHER): Payer: BLUE CROSS/BLUE SHIELD | Admitting: Physician Assistant

## 2017-04-17 ENCOUNTER — Encounter: Payer: Self-pay | Admitting: Physician Assistant

## 2017-04-17 VITALS — BP 110/70 | HR 68 | Temp 97.8°F | Resp 16 | Wt 135.0 lb

## 2017-04-17 DIAGNOSIS — N3000 Acute cystitis without hematuria: Secondary | ICD-10-CM

## 2017-04-17 DIAGNOSIS — M67432 Ganglion, left wrist: Secondary | ICD-10-CM | POA: Diagnosis not present

## 2017-04-17 LAB — POCT URINALYSIS DIPSTICK
BILIRUBIN UA: NEGATIVE
Glucose, UA: NEGATIVE
Ketones, UA: NEGATIVE
NITRITE UA: NEGATIVE
PH UA: 6.5 (ref 5.0–8.0)
PROTEIN UA: NEGATIVE
Spec Grav, UA: 1.015 (ref 1.010–1.025)
UROBILINOGEN UA: 0.2 U/dL

## 2017-04-17 MED ORDER — CIPROFLOXACIN HCL 500 MG PO TABS: 500.0000 mg | ORAL_TABLET | Freq: Two times a day (BID) | ORAL | 0 refills | Status: DC

## 2017-04-17 NOTE — Patient Instructions (Addendum)
Ganglion Cyst A ganglion cyst is a noncancerous, fluid-filled lump that occurs near joints or tendons. The ganglion cyst grows out of a joint or the lining of a tendon. It most often develops in the hand or wrist, but it can also develop in the shoulder, elbow, hip, knee, ankle, or foot. The round or oval ganglion cyst can be the size of a pea or larger than a grape. Increased activity may enlarge the size of the cyst because more fluid starts to build up. What are the causes? It is not known what causes a ganglion cyst to grow. However, it may be related to:  Inflammation or irritation around the joint.  An injury.  Repetitive movements or overuse.  Arthritis.  What increases the risk? Risk factors include:  Being a woman.  Being age 21-50.  What are the signs or symptoms? Symptoms may include:  A lump. This most often appears on the hand or wrist, but it can occur in other areas of the body.  Tingling.  Pain.  Numbness.  Muscle weakness.  Weak grip.  Less movement in a joint.  How is this diagnosed? Ganglion cysts are most often diagnosed based on a physical exam. Your health care provider will feel the lump and may shine a light alongside it. If it is a ganglion cyst, a light often shines through it. Your health care provider may order an X-ray, ultrasound, or MRI to rule out other conditions. How is this treated? Ganglion cysts usually go away on their own without treatment. If pain or other symptoms are involved, treatment may be needed. Treatment is also needed if the ganglion cyst limits your movement or if it gets infected. Treatment may include:  Wearing a brace or splint on your wrist or finger.  Taking anti-inflammatory medicine.  Draining fluid from the lump with a needle (aspiration).  Injecting a steroid into the joint.  Surgery to remove the ganglion cyst.  Follow these instructions at home:  Do not press on the ganglion cyst, poke it with a  needle, or hit it.  Take medicines only as directed by your health care provider.  Wear your brace or splint as directed by your health care provider.  Watch your ganglion cyst for any changes.  Keep all follow-up visits as directed by your health care provider. This is important. Contact a health care provider if:  Your ganglion cyst becomes larger or more painful.  You have increased redness, red streaks, or swelling.  You have pus coming from the lump.  You have weakness or numbness in the affected area.  You have a fever or chills. This information is not intended to replace advice given to you by your health care provider. Make sure you discuss any questions you have with your health care provider. Document Released: 02/02/2000 Document Revised: 07/13/2015 Document Reviewed: 07/20/2013 Elsevier Interactive Patient Education  2018 [employer] Urinary Tract Infection, Adult A urinary tract infection (UTI) is an infection of any part of the urinary tract. The urinary tract includes the:  Kidneys.  Ureters.  Bladder.  Urethra.  These organs make, store, and get rid of pee (urine) in the body. Follow these instructions at home:  Take over-the-counter and prescription medicines only as told by your doctor.  If you were prescribed an antibiotic medicine, take it as told by your doctor. Do not stop taking the antibiotic even if you start to feel better.  Avoid the following drinks: ? Alcohol. ? Caffeine. ? Tea. ?  Carbonated drinks.  Drink enough fluid to keep your pee clear or pale yellow.  Keep all follow-up visits as told by your doctor. This is important.  Make sure to: ? Empty your bladder often and completely. Do not to hold pee for long periods of time. ? Empty your bladder before and after sex. ? Wipe from front to back after a bowel movement if you are female. Use each tissue one time when you wipe. Contact a doctor if:  You have back pain.  You have  a fever.  You feel sick to your stomach (nauseous).  You throw up (vomit).  Your symptoms do not get better after 3 days.  Your symptoms go away and then come back. Get help right away if:  You have very bad back pain.  You have very bad lower belly (abdominal) pain.  You are throwing up and cannot keep down any medicines or water. This information is not intended to replace advice given to you by your health care provider. Make sure you discuss any questions you have with your health care provider. Document Released: 07/24/2007 Document Revised: 07/13/2015 Document Reviewed: 12/26/2014 Elsevier Interactive Patient Education  [employer].

## 2017-04-17 NOTE — Progress Notes (Signed)
Patient: Melissa Barr Female    DOB: 01/24/1996   22 y.o.   MRN: [id number] Visit Date: 04/17/2017  Today's Provider: Margaretann LovelessJennifer M Vega [place]row, PA-C   Chief Complaint  Patient presents with  . Urinary Tract Infection   Subjective:     Urinary Tract Infection: Patient complains of burning with urination She has had symptoms for 2 days. Patient also complains of any other symptoms. Patient denies back pain, fever and stomach ache. Patient does have a history of recurrent UTI.  Patient does not have a history of pyelonephritis.   She also reports having a "lump" on the left wrist. She reports it is getting larger and occasionally causes pain.     Allergies  Allergen Reactions  . Sulfa Antibiotics Shortness Of Breath    rash     Current Outpatient Medications:  .  norelgestromin-ethinyl estradiol Burr Medico(XULANE) 150-35 MCG/24HR transdermal patch, Change patch weekly x 3 weeks then no patch x 1 week, then repeat cycle, Disp: 3 patch, Rfl: 12  Review of Systems  Constitutional: Negative.   Respiratory: Negative.   Cardiovascular: Negative.   Genitourinary: Positive for dysuria and frequency.  Musculoskeletal: Negative.     Social History   Tobacco Use  . Smoking status: Light Tobacco Smoker    Packs/day: 0.10    Years: 1.50    Pack years: 0.15    Types: Cigarettes    Last attempt to quit: 04/18/2016    Years since quitting: 0.9  . Smokeless tobacco: Never Used  . Tobacco comment: had stopped smoking 04/18/2016 after 1 yr and restarted 12/2016  Substance Use Topics  . Alcohol use: Yes    Comment: Ocassional    Objective:   BP 110/70 (BP Location: Left Arm, Patient Position: Sitting, Cuff Size: Normal)   Pulse 68   Temp 97.8 F (36.6 C)   Resp 16   Wt 135 lb (61.2 kg)   SpO2 99%   BMI 24.69 kg/m    Physical Exam  Constitutional: She is oriented to person, place, and time. She appears well-developed and well-nourished. No distress.  Cardiovascular: Normal rate,  regular rhythm and normal heart sounds. Exam reveals no gallop and no friction rub.  No murmur heard. Pulmonary/Chest: Effort normal and breath sounds normal. No respiratory distress. She has no wheezes. She has no rales.  Abdominal: Soft. Normal appearance and bowel sounds are normal. She exhibits no distension and no mass. There is no hepatosplenomegaly. There is tenderness in the suprapubic area. There is no rebound, no guarding and no CVA tenderness.  Musculoskeletal:       Left wrist: Normal.       Arms: Neurological: She is alert and oriented to person, place, and time.  Skin: Skin is warm and dry. She is not diaphoretic.        Assessment & Plan:     1. Acute cystitis without hematuria Worsening symptoms. UA positive. Will treat empirically with Cipro as below. Continue to push fluids. Urine sent for culture. Will follow up pending C&S results. She is to call if symptoms do not improve or if they worsen.  - POCT Urinalysis Dipstick - Urine Culture - ciprofloxacin (CIPRO) 500 MG tablet; Take 1 tablet (500 mg total) by mouth 2 (two) times daily.  Dispense: 10 tablet; Refill: 0  2. Ganglion cyst of dorsum of left wrist Increasing in size and starting to cause pain occasionally. Referral placed to orthopedics as below.  - Ambulatory referral to  Orthopedic Surgery       [name], PA-C  [facility]

## 2017-04-19 LAB — URINE CULTURE

## 2017-04-21 ENCOUNTER — Telehealth: Payer: Self-pay

## 2017-04-21 NOTE — Telephone Encounter (Signed)
Patient advised as directed below.  Thanks,  -Kashaun Bebo 

## 2017-04-21 NOTE — Telephone Encounter (Signed)
-----   Message from Margaretann LovelessJennifer M Burnette, [place]PA-C sent at 04/21/2017  1:17 PM EST ----- Urine culture positive for e.coli and is susceptible to antibiotic you were placed on. Continue until completed.

## 2017-05-14 ENCOUNTER — Telehealth: Payer: Self-pay | Admitting: Certified Nurse Midwife

## 2017-05-14 ENCOUNTER — Ambulatory Visit: Payer: BLUE CROSS/BLUE SHIELD | Admitting: Certified Nurse Midwife

## 2017-05-14 NOTE — Telephone Encounter (Signed)
Patient called after hour nurse line to reschedule appt. Unable to reach patient due to invalid phone number on file

## 2017-05-14 NOTE — Telephone Encounter (Signed)
Called and left voicemail for patient to call back to reschedule.

## 2017-05-23 ENCOUNTER — Ambulatory Visit: Payer: BLUE CROSS/BLUE SHIELD | Admitting: Certified Nurse Midwife

## 2017-06-11 ENCOUNTER — Encounter: Payer: Self-pay | Admitting: Physician Assistant

## 2017-06-11 ENCOUNTER — Ambulatory Visit (INDEPENDENT_AMBULATORY_CARE_PROVIDER_SITE_OTHER): Payer: BLUE CROSS/BLUE SHIELD | Admitting: Physician Assistant

## 2017-06-11 VITALS — BP 100/68 | HR 108 | Temp 98.4°F | Wt 128.4 lb

## 2017-06-11 DIAGNOSIS — B359 Dermatophytosis, unspecified: Secondary | ICD-10-CM

## 2017-06-11 DIAGNOSIS — L309 Dermatitis, unspecified: Secondary | ICD-10-CM

## 2017-06-11 MED ORDER — CLOTRIMAZOLE 1 % EX CREA: 1.0000 "application " | TOPICAL_CREAM | Freq: Two times a day (BID) | CUTANEOUS | 0 refills | Status: DC

## 2017-06-11 MED ORDER — TRIAMCINOLONE ACETONIDE 0.1 % EX OINT: 1.0000 "application " | TOPICAL_OINTMENT | Freq: Two times a day (BID) | CUTANEOUS | 0 refills | Status: DC

## 2017-06-11 NOTE — Progress Notes (Signed)
Patient: Melissa Barr Female    DOB: 1995-07-01   21 y.o.   MRN: [id number] Visit Date: 06/12/2017  Today's Provider: Trey Sailors, PA-C   Chief Complaint  Patient presents with  . Rash   Subjective:    Melissa Barr presents with rash on face x 1 wk. It is located in her right upper cheek. It started out looking like a pimple and spread into a larger flatter spot. She reports itching. Tried steroid cream on it and this made it worse.   Rash  This is a new problem. Episode onset: 1 week ago. The problem has been gradually worsening since onset. The affected locations include the face. The rash is characterized by redness and itchiness (circular). (Headache ) Treatments tried: Kenalog Cream. She is requesting a refill. The treatment provided no relief. Her past medical history is significant for eczema.     Allergies  Allergen Reactions  . Sulfa Antibiotics Shortness Of Breath    rash     Current Outpatient Medications:  .  norelgestromin-ethinyl estradiol Burr Medico) 150-35 MCG/24HR transdermal patch, Change patch weekly x 3 weeks then no patch x 1 week, then repeat cycle, Disp: 3 patch, Rfl: 12 .  clotrimazole (LOTRIMIN AF) 1 % cream, Apply 1 application topically 2 (two) times daily., Disp: 30 g, Rfl: 0 .  triamcinolone ointment (KENALOG) 0.1 %, Apply 1 application topically 2 (two) times daily., Disp: 30 g, Rfl: 0  Review of Systems  Constitutional: Negative.   Respiratory: Negative.   Cardiovascular: Negative.   Skin: Positive for rash.    Social History   Tobacco Use  . Smoking status: Light Tobacco Smoker    Packs/day: 0.10    Years: 1.50    Pack years: 0.15    Types: Cigarettes    Last attempt to quit: 04/18/2016    Years since quitting: 1.1  . Smokeless tobacco: Never Used  . Tobacco comment: had stopped smoking 04/18/2016 after 1 yr and restarted 12/2016  Substance Use Topics  . Alcohol use: Yes    Comment: Ocassional    Objective:   BP  100/68 (BP Location: Right Arm, Patient Position: Sitting, Cuff Size: Normal)   Pulse (!) 108   Temp 98.4 F (36.9 C) (Oral)   Wt 128 lb 6.4 oz (58.2 kg)   SpO2 98%   BMI 23.48 kg/m    Physical Exam  Constitutional: She is oriented to person, place, and time. She appears well-developed and well-nourished.  Neck: Neck supple.  Lymphadenopathy:    She has no cervical adenopathy.  Neurological: She is alert and oriented to person, place, and time.  Skin: Skin is warm and dry. Rash noted.  Lesion on right cheek inferior to the eye. Erythematous, red lesion with central clearing and raised scaly borders.         Assessment & Plan:     1. Ringworm  Use cream twice daily and continue using until 1 week after disappearance of lesion. Do not use steroid creams. May cover with lightly with band aid, wash hands after touching and do not share face towels.  - clotrimazole (LOTRIMIN AF) 1 % cream; Apply 1 application topically 2 (two) times daily.  Dispense: 30 g; Refill: 0  2. Eczema, unspecified type  - triamcinolone ointment (KENALOG) 0.1 %; Apply 1 application topically 2 (two) times daily.  Dispense: 30 g; Refill: 0  Return if symptoms worsen or fail to improve.  The entirety of  the information documented in the History of Present Illness, Review of Systems and Physical Exam were personally obtained by me. Portions of this information were initially documented by, CMA and reviewed by me for thoroughness and accuracy.        Trey SailorsAdriana M Pollak, PA-C  [facility]

## 2017-06-11 NOTE — Patient Instructions (Signed)

## 2017-06-30 ENCOUNTER — Encounter: Payer: Self-pay | Admitting: Certified Nurse Midwife

## 2017-06-30 ENCOUNTER — Ambulatory Visit (INDEPENDENT_AMBULATORY_CARE_PROVIDER_SITE_OTHER): Payer: BLUE CROSS/BLUE SHIELD | Admitting: Certified Nurse Midwife

## 2017-06-30 VITALS — BP 92/58 | HR 90 | Ht 62.0 in | Wt 127.0 lb

## 2017-06-30 DIAGNOSIS — Z30011 Encounter for initial prescription of contraceptive pills: Secondary | ICD-10-CM

## 2017-06-30 DIAGNOSIS — N926 Irregular menstruation, unspecified: Secondary | ICD-10-CM

## 2017-06-30 LAB — POCT URINE PREGNANCY: PREG TEST UR: NEGATIVE

## 2017-06-30 MED ORDER — NORETHIN ACE-ETH ESTRAD-FE 1-20 MG-MCG(24) PO TABS: 1.0000 | ORAL_TABLET | Freq: Every day | ORAL | 3 refills | Status: DC

## 2017-06-30 NOTE — Progress Notes (Signed)
  History of Present Illness:  Melissa Barr is a 22 y.o. who was started on the contraceptive patch  Current Outpatient Medications on File Prior to Visit  Medication Sig Dispense Refill  . clotrimazole (LOTRIMIN AF) 1 % cream Apply 1 application topically 2 (two) times daily. 30 g 0  . triamcinolone ointment (KENALOG) 0.1 % Apply 1 application topically 2 (two) times daily. 30 g 0  . norelgestromin-ethinyl estradiol Burr Medico) 150-35 MCG/24HR transdermal patch Change patch weekly x 3 weeks then no patch x 1 week, then repeat cycle (Patient not taking: Reported on 06/30/2017) 3 patch 12   No current facility-administered medications on file prior to visit.    approximately 3.5 months ago after her Nexplanon was removed. She stopped using the patch the end of April because she had difficulty with the patch not sticking. She would like to switch to a OCP. Has been using condoms in the meantime. Her LMP was 05/21/2017 and her menses is late this month.  PMHx: She  has a past medical history of Allergy, Dysmenorrhea, Esophageal reflux, and Human papilloma virus (HPV) type 9 vaccine administered. Also,  has a past surgical history that includes No past surgeries., family history includes Bladder Cancer (age of onset: 92) in her maternal grandfather; Breast cancer (age of onset: 81) in her maternal aunt; Colon cancer (age of onset: 75) in her maternal uncle; Heart disease in her maternal grandfather, maternal grandmother, and paternal grandfather; Hyperlipidemia in her father; Hypertension in her maternal grandfather and maternal grandmother; Thyroid cancer in her mother; Uterine cancer (age of onset: 53) in her other.,  reports that she has been smoking cigarettes.  She has a 0.15 pack-year smoking history. She has never used smokeless tobacco. She reports that she drinks alcohol. She reports that she does not use drugs.  She has a current medication list which includes the following prescription(s):  clotrimazole, triamcinolone ointment, and norelgestromin-ethinyl estradiol. Also, is allergic to sulfa antibiotics.  ROS  Physical Exam:  BP (!) 92/58   Pulse 90   Ht  (1.575 m)   Wt 127 lb (57.6 kg)   LMP 05/21/2017 (Approximate)   BMI 23.23 kg/m  Body mass index is 23.23 kg/m. Constitutional: Well nourished, well developed female in no acute distress.   Neuro: Grossly intact Psych:  Normal mood and affect.    Urine pregnancy test is negative    Assessment: Stopped using the patch and would to use the pill Plan: Discussed how to take the pill, when to use back up contraception, possible side effects, and risks of thromboembolism (as well as warning signs). GIven sample of [place]. Will call in Microgestin 24 #1/RF x 3. To start the pill when her menses starts.  Follow up in 2-3 months  Farrel Conners, CNM [facility], [facility] 06/30/2017  5:37 PM

## 2017-10-16 ENCOUNTER — Ambulatory Visit (INDEPENDENT_AMBULATORY_CARE_PROVIDER_SITE_OTHER): Payer: BLUE CROSS/BLUE SHIELD | Admitting: Physician Assistant

## 2017-10-16 ENCOUNTER — Encounter: Payer: Self-pay | Admitting: Physician Assistant

## 2017-10-16 VITALS — BP 100/60 | HR 62 | Temp 97.5°F | Resp 16 | Wt 122.8 lb

## 2017-10-16 DIAGNOSIS — N3000 Acute cystitis without hematuria: Secondary | ICD-10-CM

## 2017-10-16 DIAGNOSIS — R300 Dysuria: Secondary | ICD-10-CM | POA: Diagnosis not present

## 2017-10-16 LAB — POCT URINALYSIS DIPSTICK
Bilirubin, UA: NEGATIVE
GLUCOSE UA: NEGATIVE
KETONES UA: NEGATIVE
NITRITE UA: POSITIVE
Protein, UA: NEGATIVE
Spec Grav, UA: 1.015 (ref 1.010–1.025)
Urobilinogen, UA: 0.2 E.U./dL
pH, UA: 6.5 (ref 5.0–8.0)

## 2017-10-16 MED ORDER — NITROFURANTOIN MONOHYD MACRO 100 MG PO CAPS
100.0000 mg | ORAL_CAPSULE | Freq: Two times a day (BID) | ORAL | 0 refills | Status: DC
Start: 2017-10-16 — End: 2018-01-19

## 2017-10-16 NOTE — Progress Notes (Signed)
Patient: Melissa Barr Female    DOB: 1996-01-01   22 y.o.   MRN: [id number] Visit Date: 10/16/2017  Today's Provider: Margaretann LovelessJennifer M Burnette, PA-C   Chief Complaint  Patient presents with  . Urinary Tract Infection   Subjective:    Urinary Tract Infection   This is a new problem. The current episode started in the past 7 days (2 days ago). The problem occurs every urination. The problem has been gradually worsening. The quality of the pain is described as burning. The patient is experiencing no pain. There has been no fever. She is sexually active. There is no history of pyelonephritis. Associated symptoms include frequency and urgency. Pertinent negatives include no chills, discharge, flank pain, hematuria or possible pregnancy. Treatments tried: AZO. The treatment provided no relief. Her past medical history is significant for recurrent UTIs.       Allergies  Allergen Reactions  . Sulfa Antibiotics Shortness Of Breath    rash     Current Outpatient Medications:  .  clotrimazole (LOTRIMIN AF) 1 % cream, Apply 1 application topically 2 (two) times daily., Disp: 30 g, Rfl: 0 .  Norethindrone Acetate-Ethinyl Estrad-FE (MICROGESTIN 24 FE) 1-20 MG-MCG(24) tablet, Take 1 tablet by mouth daily., Disp: 1 Package, Rfl: 3 .  triamcinolone ointment (KENALOG) 0.1 %, Apply 1 application topically 2 (two) times daily., Disp: 30 g, Rfl: 0  Review of Systems  Constitutional: Negative for chills.  Genitourinary: Positive for dysuria, frequency and urgency. Negative for flank pain, hematuria, pelvic pain, vaginal discharge and vaginal pain.    Social History   Tobacco Use  . Smoking status: Light Tobacco Smoker    Packs/day: 0.10    Years: 1.50    Pack years: 0.15    Types: Cigarettes    Last attempt to quit: 04/18/2016    Years since quitting: 1.4  . Smokeless tobacco: Never Used  . Tobacco comment: had stopped smoking 04/18/2016 after 1 yr and restarted 12/2016  Substance Use  Topics  . Alcohol use: Yes    Comment: Ocassional    Objective:   BP 100/60 (BP Location: Left Arm, Patient Position: Sitting, Cuff Size: Normal)   Pulse 62   Temp (!) 97.5 F (36.4 C) (Oral)   Resp 16   Wt 122 lb 12.8 oz (55.7 kg)   LMP 09/12/2017 (Approximate)   BMI 22.46 kg/m  Vitals:   10/16/17 0809  BP: 100/60  Pulse: 62  Resp: 16  Temp: (!) 97.5 F (36.4 C)  TempSrc: Oral  Weight: 122 lb 12.8 oz (55.7 kg)     Physical Exam  Constitutional: She is oriented to person, place, and time. She appears well-developed and well-nourished. No distress.  Cardiovascular: Normal rate, regular rhythm and normal heart sounds. Exam reveals no gallop and no friction rub.  No murmur heard. Pulmonary/Chest: Effort normal and breath sounds normal. No respiratory distress. She has no wheezes. She has no rales.  Abdominal: Soft. Normal appearance and bowel sounds are normal. She exhibits no distension and no mass. There is no hepatosplenomegaly. There is no tenderness. There is no rebound, no guarding and no CVA tenderness.  Neurological: She is alert and oriented to person, place, and time.  Skin: Skin is warm and dry. She is not diaphoretic.       Assessment & Plan:     1. Acute cystitis without hematuria Worsening symptoms. UA positive. Will treat empirically with Bactrim. Continue to push fluids. Urine sent for  culture. Will follow up pending C&S results. She is to call if symptoms do not improve or if they worsen.  - Urine Culture - nitrofurantoin, macrocrystal-monohydrate, (MACROBID) 100 MG capsule; Take 1 capsule (100 mg total) by mouth 2 (two) times daily.  Dispense: 14 capsule; Refill: 0  2. Dysuria See above medical treatment plan. - POCT urinalysis dipstick       [name], PA-C  [facility]

## 2017-10-18 LAB — URINE CULTURE

## 2017-10-21 ENCOUNTER — Telehealth: Payer: Self-pay

## 2017-10-21 NOTE — Telephone Encounter (Signed)
LMTCB

## 2017-10-21 NOTE — Telephone Encounter (Signed)
-----   Message from [name], PA-C sent at 10/21/2017  2:55 PM EDT ----- Urine culture positive for e. Coli. It was susceptible to the antibiotic you were placed on. Continue until completed.

## 2017-10-22 NOTE — Telephone Encounter (Signed)
LMTCB  Thanks,  -Joseline 

## 2017-10-22 NOTE — Telephone Encounter (Signed)
Patient advised as directed below.  Thanks,  -Haskel Dewalt 

## 2017-12-23 ENCOUNTER — Encounter: Payer: BLUE CROSS/BLUE SHIELD | Admitting: Advanced Practice Midwife

## 2017-12-30 ENCOUNTER — Other Ambulatory Visit: Payer: Self-pay | Admitting: Advanced Practice Midwife

## 2017-12-30 DIAGNOSIS — Z91410 Personal history of adult physical and sexual abuse: Secondary | ICD-10-CM | POA: Insufficient documentation

## 2017-12-30 DIAGNOSIS — Z369 Encounter for antenatal screening, unspecified: Secondary | ICD-10-CM

## 2017-12-30 LAB — HM PAP SMEAR: HM Pap smear: NEGATIVE
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
## 2018-01-19 ENCOUNTER — Ambulatory Visit
Admission: RE | Admit: 2018-01-19 | Discharge: 2018-01-19 | Disposition: A | Discharge status: Home / Self Care | Payer: BLUE CROSS/BLUE SHIELD | Source: Ambulatory Visit | Attending: Advanced Practice Midwife | Admitting: Advanced Practice Midwife

## 2018-01-19 ENCOUNTER — Ambulatory Visit (HOSPITAL_BASED_OUTPATIENT_CLINIC_OR_DEPARTMENT_OTHER)
Admission: RE | Admit: 2018-01-19 | Discharge: 2018-01-19 | Disposition: A | Discharge status: Home / Self Care | Payer: BLUE CROSS/BLUE SHIELD | Source: Ambulatory Visit | Attending: Obstetrics and Gynecology | Admitting: Obstetrics and Gynecology

## 2018-01-19 VITALS — BP 114/67 | HR 79 | Temp 98.2°F | Resp 18 | Ht 62.0 in | Wt 121.6 lb

## 2018-01-19 DIAGNOSIS — Z369 Encounter for antenatal screening, unspecified: Secondary | ICD-10-CM

## 2018-01-19 DIAGNOSIS — Z3A11 11 weeks gestation of pregnancy: Secondary | ICD-10-CM | POA: Insufficient documentation

## 2018-01-19 NOTE — Progress Notes (Addendum)
Referring physician: [facility] Length of Consultation: 40 minutes   Melissa Barr  was referred to [facility] for genetic counseling to review prenatal screening and testing options.  This note summarizes the information we discussed.    We offered the following routine screening tests for this pregnancy:  First trimester screening, which includes nuchal translucency ultrasound screen and first trimester maternal serum marker screening.  The nuchal translucency has approximately an 80% detection rate for Down syndrome and can be positive for other chromosome abnormalities as well as congenital heart defects.  When combined with a maternal serum marker screening, the detection rate is up to 90% for Down syndrome and up to 97% for trisomy 18.     Maternal serum marker screening, a blood test that measures pregnancy proteins, can provide risk assessments for Down syndrome, trisomy 18, and open neural tube defects (spina bifida, anencephaly). Because it does not directly examine the fetus, it cannot positively diagnose or rule out these problems.  Targeted ultrasound uses high frequency sound waves to create an image of the developing fetus.  An ultrasound is often recommended as a routine means of evaluating the pregnancy.  It is also used to screen for fetal anatomy problems (for example, a heart defect) that might be suggestive of a chromosomal or other abnormality.   Should these screening tests indicate an increased concern, then the following additional testing options would be offered:  The chorionic villus sampling procedure is available for first trimester chromosome analysis.  This involves the withdrawal of a small amount of chorionic villi (tissue from the developing placenta).  Risk of pregnancy loss is estimated to be approximately 1 in 200 to 1 in 100 (0.5 to 1%).  There is approximately a 1% (1 in 100) chance that the CVS chromosome  results will be unclear.  Chorionic villi cannot be tested for neural tube defects.     Amniocentesis involves the removal of a small amount of amniotic fluid from the sac surrounding the fetus with the use of a thin needle inserted through the maternal abdomen and uterus.  Ultrasound guidance is used throughout the procedure.  Fetal cells from amniotic fluid are directly evaluated and > 99.5% of chromosome problems and > 98% of open neural tube defects can be detected. This procedure is generally performed after the 15th week of pregnancy.  The main risks to this procedure include complications leading to miscarriage in less than 1 in 200 cases (0.5%).  As another option for information if the pregnancy is suspected to be an an increased chance for certain chromosome conditions, we also reviewed the availability of cell free fetal DNA testing from maternal blood to determine whether or not the baby may have either Down syndrome, trisomy 40, or trisomy 75.  This test utilizes a maternal blood sample and DNA sequencing technology to isolate circulating cell free fetal DNA from maternal plasma.  The fetal DNA can then be analyzed for DNA sequences that are derived from the three most common chromosomes involved in aneuploidy, chromosomes 13, 18, and 21.  If the overall amount of DNA is greater than the expected level for any of these chromosomes, aneuploidy is suspected.  While we do not consider it a replacement for invasive testing and karyotype analysis, a negative result from this testing would be reassuring, though not a guarantee of a normal chromosome complement for the baby.  An abnormal result is certainly suggestive of an abnormal chromosome complement, though  we would still recommend CVS or amniocentesis to confirm any findings from this testing.  Cystic Fibrosis and Spinal Muscular Atrophy (SMA) screening were also discussed with the patient. Both conditions are recessive, which means that both  parents must be carriers in order to have a child with the disease.  Cystic fibrosis (CF) is one of the most common genetic conditions in persons of Caucasian ancestry.  This condition occurs in approximately 1 in 2,500 Caucasian persons and results in thickened secretions in the lungs, digestive, and reproductive systems.  For a baby to be at risk for having CF, both of the parents must be carriers for this condition.  Approximately 1 in 66 Caucasian persons is a carrier for CF.  Current carrier testing looks for the most common mutations in the gene for CF and can detect approximately 90% of carriers in the Caucasian population.  This means that the carrier screening can greatly reduce, but cannot eliminate, the chance for an individual to have a child with CF.  If an individual is found to be a carrier for CF, then carrier testing would be available for the partner. As part of [place]'s newborn screening profile, all babies born in the state of [place] will have a two-tier screening process.  Specimens are first tested to determine the concentration of immunoreactive trypsinogen (IRT).  The top 5% of specimens with the highest IRT values then undergo DNA testing using a panel of over 40 common CF mutations. SMA is a neurodegenerative disorder that leads to atrophy of skeletal muscle and overall weakness.  This condition is also more prevalent in the Caucasian population, with 1 in 40-1 in 60 persons being a carrier and 1 in 6,000-1 in 10,000 children being affected.  There are multiple forms of the disease, with some causing death in infancy to other forms with survival into adulthood.  The genetics of SMA is complex, but carrier screening can detect up to 95% of carriers in the Caucasian population.  Similar to CF, a negative result can greatly reduce, but cannot eliminate, the chance to have a child with SMA.  We obtained a detailed family history and pregnancy history.  The patient reported one  cousin with congenital deafness, but she was not sure who the individual is or how they are related.  If more is learned about this relative and any possible genetic testing to explain her condition, we are happy to review this further.  Generally speaking, approximately half of congenital hearing loss is thought to be genetic, with most cases being recessive.  This means that both Melissa Barr and her partner would have to be carriers to have an affected child.  He has no family history of hearing loss and they are not related to each other, so it is unlikely that this pregnancy is at high risk. However, if more is learned about this history, we are happy to speak further. The remainder of the family history was reported to be unremarkable for birth defects, intellectual delays, recurrent pregnancy loss or known chromosome abnormalities.  Melissa Barr stated that this is her first pregnancy.  Her partner, Melissa Barr, has a healthy 68 year old son.  She reported no complications or exposures in this pregnancy since she found out she was pregnant at [redacted] weeks gestation.  Prior to that time, she was smoking marijuana.  The use of marijuana in pregnancy is known to be associated with low birth weight and premature delivery.  We therefore suggested the  patient continue to avoid smoking marijuana during this time.  After consideration of the options, Melissa Barr elected to proceed with first trimester screening.  She declined carrier testing for CF, SMA and hemoglobinopathies.  An ultrasound was performed at the time of the visit.  The gestational age was consistent with 11 weeks, 6 days.  Fetal anatomy could not be assessed due to early gestational age.  Please refer to the ultrasound report for details of that study.  Melissa Barr was encouraged to call with questions or concerns.  We can be contacted at [phone].  Labs ordered: first trimester screening  Cherly Andersoneborah F. Wells, MS, CGC  I saw the patient and reviewed the  ultrasound and plan for screening - I agree with the summary offered above . Jimmey RalphLivingston, Breannah Kratt, MD

## 2018-01-22 ENCOUNTER — Telehealth: Payer: Self-pay | Admitting: Obstetrics and Gynecology

## 2018-01-22 NOTE — Telephone Encounter (Signed)
  Ms. Eppie GibsonStahl elected to undergo First Trimester screening on 01/19/2018.  To review, first trimester screening, includes nuchal translucency ultrasound screen and/or first trimester maternal serum marker screening.  The nuchal translucency has approximately an 80% detection rate for Down syndrome and can be positive for other chromosome abnormalities as well as heart defects.  When combined with a maternal serum marker screening, the detection rate is up to 90% for Down syndrome and up to 97% for trisomy 13 and 18.     The results of the First Trimester Nuchal Translucency and Biochemical Screening were within normal range.  The risk for Down syndrome is now estimated to be less than 1 in 10,000.  The risk for Trisomy 13/18 is also estimated to be less than 1 in 10,000.  Should more definitive information be desired, we would offer amniocentesis.  Because we do not yet know the effectiveness of combined first and second trimester screening, we do not recommend a maternal serum screen to assess the chance for chromosome conditions.  However, if screening for neural tube defects is desired, maternal serum screening for AFP only can be performed between 15 and [redacted] weeks gestation.    We may be reached at [phone].  Cherly Andersoneborah F. Bayan Kushnir, MS, CGC

## 2018-02-18 NOTE — L&D Delivery Note (Signed)
Vaginal Delivery Note  Spontaneous delivery of live viable female infant from the LOA position through an intact  perineum. Delivery of anterior right shoulder with gentle downward guidance followed by delivery of the left posterior shoulder with gentle upward guidance. Body followed spontaneously. Infant placed on maternal chest. Nursery present and helped with neonatal resuscitation and evaluation. Cord clamped and cut after one minute. Cord blood collected. Placenta delivered spontaneously and intact with a 3 vessel cord.  Patient had brisk vaginal bleeding after the delivery of the placenta. She was given 800 mcg of rectal Cytotec and IM Methergine. Left and right labial laceration. Left labial laceration was very deep. Small 1st degree perineal. Uterus firm and below umbilicus at the end of the delivery.  Mom and baby recovering in stable condition. Sponge and needle counts were correct at the end of the delivery.  APGARS: 1 minute:9 5 minutes: 9 Weight: pending  Adelene Idler MD [facility], [facility] 07/24/18 2:11 AM

## 2018-03-05 ENCOUNTER — Other Ambulatory Visit: Payer: Self-pay

## 2018-03-05 DIAGNOSIS — Z3402 Encounter for supervision of normal first pregnancy, second trimester: Secondary | ICD-10-CM

## 2018-03-12 ENCOUNTER — Ambulatory Visit
Admission: RE | Admit: 2018-03-12 | Discharge: 2018-03-12 | Disposition: A | Discharge status: Home / Self Care | Payer: BLUE CROSS/BLUE SHIELD | Source: Ambulatory Visit | Attending: Maternal & Fetal Medicine | Admitting: Maternal & Fetal Medicine

## 2018-03-12 DIAGNOSIS — Z3402 Encounter for supervision of normal first pregnancy, second trimester: Secondary | ICD-10-CM | POA: Insufficient documentation

## 2018-05-14 LAB — HM HIV SCREENING LAB: HM HIV Screening: NEGATIVE

## 2018-07-07 ENCOUNTER — Other Ambulatory Visit: Payer: Self-pay | Admitting: Obstetrics and Gynecology

## 2018-07-07 DIAGNOSIS — Z3493 Encounter for supervision of normal pregnancy, unspecified, third trimester: Secondary | ICD-10-CM

## 2018-07-09 ENCOUNTER — Other Ambulatory Visit: Payer: Self-pay

## 2018-07-09 ENCOUNTER — Ambulatory Visit (INDEPENDENT_AMBULATORY_CARE_PROVIDER_SITE_OTHER): Payer: BLUE CROSS/BLUE SHIELD

## 2018-07-09 DIAGNOSIS — Z3493 Encounter for supervision of normal pregnancy, unspecified, third trimester: Secondary | ICD-10-CM

## 2018-07-09 DIAGNOSIS — Z3689 Encounter for other specified antenatal screening: Secondary | ICD-10-CM

## 2018-07-23 ENCOUNTER — Inpatient Hospital Stay
Admission: AD | Admit: 2018-07-23 | Discharge: 2018-07-25 | DRG: 807 | Disposition: A | Discharge status: Home / Self Care | Payer: BC Managed Care – PPO | Attending: Obstetrics and Gynecology | Admitting: Obstetrics and Gynecology

## 2018-07-23 ENCOUNTER — Other Ambulatory Visit: Payer: Self-pay

## 2018-07-23 DIAGNOSIS — Z3A38 38 weeks gestation of pregnancy: Secondary | ICD-10-CM

## 2018-07-23 DIAGNOSIS — O9902 Anemia complicating childbirth: Secondary | ICD-10-CM | POA: Diagnosis present

## 2018-07-23 DIAGNOSIS — F17210 Nicotine dependence, cigarettes, uncomplicated: Secondary | ICD-10-CM | POA: Diagnosis present

## 2018-07-23 DIAGNOSIS — O99334 Smoking (tobacco) complicating childbirth: Secondary | ICD-10-CM | POA: Diagnosis present

## 2018-07-23 DIAGNOSIS — Z1159 Encounter for screening for other viral diseases: Secondary | ICD-10-CM

## 2018-07-23 DIAGNOSIS — O26893 Other specified pregnancy related conditions, third trimester: Secondary | ICD-10-CM | POA: Diagnosis present

## 2018-07-23 DIAGNOSIS — O479 False labor, unspecified: Secondary | ICD-10-CM | POA: Diagnosis present

## 2018-07-23 DIAGNOSIS — D649 Anemia, unspecified: Secondary | ICD-10-CM | POA: Diagnosis present

## 2018-07-23 LAB — SARS CORONAVIRUS 2 BY RT PCR (HOSPITAL ORDER, PERFORMED IN ~~LOC~~ HOSPITAL LAB): SARS Coronavirus 2: NEGATIVE

## 2018-07-23 LAB — CBC
HCT: 34 % — ABNORMAL LOW (ref 36.0–46.0)
Hemoglobin: 11.1 g/dL — ABNORMAL LOW (ref 12.0–15.0)
MCH: 29.4 pg (ref 26.0–34.0)
MCHC: 32.6 g/dL (ref 30.0–36.0)
MCV: 90.2 fL (ref 80.0–100.0)
Platelets: 248 10*3/uL (ref 150–400)
RBC: 3.77 MIL/uL — ABNORMAL LOW (ref 3.87–5.11)
RDW: 12.6 % (ref 11.5–15.5)
WBC: 21.7 10*3/uL — ABNORMAL HIGH (ref 4.0–10.5)
nRBC: 0 % (ref 0.0–0.2)

## 2018-07-23 LAB — ABO/RH: ABO/RH(D): A NEG

## 2018-07-23 LAB — RAPID HIV SCREEN (HIV 1/2 AB+AG)
HIV 1/2 Antibodies: NONREACTIVE
HIV-1 P24 Antigen - HIV24: NONREACTIVE

## 2018-07-23 MED ORDER — PENICILLIN G 3 MILLION UNITS IVPB - SIMPLE MED
3.0000 10*6.[IU] | INTRAVENOUS | Status: DC
  Filled 2018-07-23 (×5): qty 100

## 2018-07-23 MED ORDER — BUTORPHANOL TARTRATE 1 MG/ML IJ SOLN
1.0000 mg | INTRAMUSCULAR | Status: DC | PRN
  Administered 2018-07-23: 1 mg via INTRAVENOUS
  Filled 2018-07-23 (×3): qty 1

## 2018-07-23 MED ORDER — ONDANSETRON HCL 4 MG/2ML IJ SOLN: 4.0000 mg | Freq: Four times a day (QID) | INTRAMUSCULAR | Status: DC | PRN

## 2018-07-23 MED ORDER — LACTATED RINGERS IV SOLN: 500.0000 mL | INTRAVENOUS | Status: DC | PRN

## 2018-07-23 MED ORDER — LIDOCAINE HCL (PF) 1 % IJ SOLN
30.0000 mL | INTRAMUSCULAR | Status: AC | PRN
  Administered 2018-07-24: 01:00:00 30 mL via SUBCUTANEOUS
  Filled 2018-07-23: qty 30

## 2018-07-23 MED ORDER — LIDOCAINE HCL (PF) 1 % IJ SOLN
INTRAMUSCULAR | Status: AC
  Administered 2018-07-24: 01:00:00 30 mL via SUBCUTANEOUS
  Filled 2018-07-23: qty 30

## 2018-07-23 MED ORDER — LACTATED RINGERS IV SOLN
INTRAVENOUS | Status: DC
  Administered 2018-07-23: 17:00:00 via INTRAVENOUS

## 2018-07-23 MED ORDER — OXYTOCIN 40 UNITS IN NORMAL SALINE INFUSION - SIMPLE MED
2.5000 [IU]/h | INTRAVENOUS | Status: DC
  Filled 2018-07-23: qty 1000

## 2018-07-23 MED ORDER — OXYTOCIN 10 UNIT/ML IJ SOLN
INTRAMUSCULAR | Status: AC
  Filled 2018-07-23: qty 2

## 2018-07-23 MED ORDER — MISOPROSTOL 200 MCG PO TABS
ORAL_TABLET | ORAL | Status: AC
  Filled 2018-07-23: qty 4

## 2018-07-23 MED ORDER — AMMONIA AROMATIC IN INHA
RESPIRATORY_TRACT | Status: AC
  Filled 2018-07-23: qty 10

## 2018-07-23 MED ORDER — SODIUM CHLORIDE 0.9 % IV SOLN
5.0000 10*6.[IU] | Freq: Once | INTRAVENOUS | Status: DC
  Filled 2018-07-23: qty 5

## 2018-07-23 MED ORDER — BUTORPHANOL TARTRATE 2 MG/ML IJ SOLN
1.0000 mg | INTRAMUSCULAR | Status: DC | PRN
  Administered 2018-07-23: 18:00:00 1 mg via INTRAVENOUS
  Filled 2018-07-23: qty 1

## 2018-07-23 MED ORDER — SOD CITRATE-CITRIC ACID 500-334 MG/5ML PO SOLN: 30.0000 mL | ORAL | Status: DC | PRN

## 2018-07-23 MED ORDER — OXYTOCIN BOLUS FROM INFUSION
500.0000 mL | Freq: Once | INTRAVENOUS | Status: AC
  Administered 2018-07-24: 01:00:00 500 mL via INTRAVENOUS

## 2018-07-23 NOTE — H&P (Signed)
History and Physical  Melissa Barr is an 23 y.o. female.  HPI: Patient presented to labor and delivery complaining of vaginal bleeding. She says that for 3 days she has had a small amount of vaginal bleeding. Yesterday she lost her mucus plug. She made cervical change in triage from 3 to 5 cm.    Clinic [facility] Prenatal Labs  Dating  LMP = 11wk [place] Blood type: --/--/A NEG (06/04 1718)   Genetic Screen 1 Screen:    negative Antibody:negative  Anatomic [place] normal Rubella:  Immune  Varicella: Immune  GTT  28 wk:114      RPR:   nonreactive  Rhogam  05/14/2018 HBsAg:   nonreactive  TDaP vaccine   05/17/2018                    HIV: NON REACTIVE (06/04 1616)   Flu Shot   12/30/2017                             GBS: negative  Contraception  Birth control pills Pap:   CBB     CS/VBAC  no history   Baby Food  Breast   Support Person       Past Medical History:  Diagnosis Date  . Allergy   . Dysmenorrhea   . Esophageal reflux   . Human papilloma virus (HPV) type 9 vaccine administered    gardisil series complete    Past Surgical History:  Procedure Laterality Date  . NO PAST SURGERIES      Family History  Problem Relation Age of Onset  . Thyroid cancer Mother   . Hyperlipidemia Father   . Heart disease Maternal Grandmother   . Hypertension Maternal Grandmother   . Bladder Cancer Maternal Grandfather 40  . Heart disease Maternal Grandfather   . Hypertension Maternal Grandfather   . Heart disease Paternal Grandfather   . Breast cancer Maternal Aunt 35  . Colon cancer Maternal Uncle 50  . Uterine cancer Other 69    Social History:  reports that she has been smoking cigarettes. She has a 0.15 pack-year smoking history. She has never used smokeless tobacco. She reports previous alcohol use. She reports previous drug use.  Allergies:  Allergies  Allergen Reactions  . Sulfa Antibiotics Shortness Of Breath    rash    Medications: I have reviewed the patient's current  medications.  No results found for this or any previous visit (from the past 48 hour(s)).  No results found.  Review of Systems  Constitutional: Negative for chills, fever, malaise/fatigue and weight loss.  HENT: Negative for congestion, hearing loss and sinus pain.   Eyes: Negative for blurred vision and double vision.  Respiratory: Negative for cough, sputum production, shortness of breath and wheezing.   Cardiovascular: Negative for chest pain, palpitations, orthopnea and leg swelling.  Gastrointestinal: Negative for abdominal pain, constipation, diarrhea, nausea and vomiting.  Genitourinary: Negative for dysuria, flank pain, frequency, hematuria and urgency.  Musculoskeletal: Negative for back pain, falls and joint pain.  Skin: Negative for itching and rash.  Neurological: Negative for dizziness and headaches.  Psychiatric/Behavioral: Negative for depression, substance abuse and suicidal ideas. The patient is not nervous/anxious.    Blood pressure 124/72, pulse 91, temperature 98.1 F (36.7 C), temperature source Oral, resp. rate 16, height 5\' 2"  (1.575 m), weight 73.9 kg, last menstrual period 10/28/2017. Physical Exam  Assessment/Plan: 23 yo G1P0000 [redacted]w[redacted]d 1. Labor- will  admit, expectant managment 2. Epidural PRN 3. GBS unknown, requesting updated records  - UPDATE GBS negative 4. Cephiad covid-19 testing  Kendall Justo R Taeveon Keesling 07/23/2018, 2:39 PM

## 2018-07-24 DIAGNOSIS — Z3A38 38 weeks gestation of pregnancy: Secondary | ICD-10-CM

## 2018-07-24 LAB — BPAM RBC
Blood Product Expiration Date: 202006262359
Blood Product Expiration Date: 202007012359
Unit Type and Rh: 600
Unit Type and Rh: 600

## 2018-07-24 LAB — TYPE AND SCREEN
ABO/RH(D): A NEG
Antibody Screen: POSITIVE
Unit division: 0
Unit division: 0

## 2018-07-24 LAB — CBC
HCT: 32 % — ABNORMAL LOW (ref 36.0–46.0)
Hemoglobin: 10.5 g/dL — ABNORMAL LOW (ref 12.0–15.0)
MCH: 29.6 pg (ref 26.0–34.0)
MCHC: 32.8 g/dL (ref 30.0–36.0)
MCV: 90.1 fL (ref 80.0–100.0)
Platelets: 252 10*3/uL (ref 150–400)
RBC: 3.55 MIL/uL — ABNORMAL LOW (ref 3.87–5.11)
RDW: 12.3 % (ref 11.5–15.5)
WBC: 31.3 10*3/uL — ABNORMAL HIGH (ref 4.0–10.5)
nRBC: 0 % (ref 0.0–0.2)

## 2018-07-24 MED ORDER — RHO D IMMUNE GLOBULIN 1500 UNIT/2ML IJ SOSY
300.0000 ug | PREFILLED_SYRINGE | Freq: Once | INTRAMUSCULAR | Status: DC
  Filled 2018-07-24: qty 2

## 2018-07-24 MED ORDER — SIMETHICONE 80 MG PO CHEW: 80.0000 mg | CHEWABLE_TABLET | ORAL | Status: DC | PRN

## 2018-07-24 MED ORDER — HYDROMORPHONE HCL 1 MG/ML IJ SOLN
INTRAMUSCULAR | Status: AC
  Administered 2018-07-24: 01:00:00 0.4 mg via INTRAVENOUS
  Filled 2018-07-24: qty 1

## 2018-07-24 MED ORDER — WITCH HAZEL-GLYCERIN EX PADS: 1.0000 "application " | MEDICATED_PAD | CUTANEOUS | Status: DC | PRN

## 2018-07-24 MED ORDER — DIPHENHYDRAMINE HCL 25 MG PO CAPS: 25.0000 mg | ORAL_CAPSULE | Freq: Four times a day (QID) | ORAL | Status: DC | PRN

## 2018-07-24 MED ORDER — METHYLERGONOVINE MALEATE 0.2 MG/ML IJ SOLN
0.2000 mg | Freq: Once | INTRAMUSCULAR | Status: AC
  Administered 2018-07-24: 0.2 mg via INTRAMUSCULAR

## 2018-07-24 MED ORDER — MEASLES, MUMPS & RUBELLA VAC IJ SOLR
0.5000 mL | Freq: Once | INTRAMUSCULAR | Status: DC
  Filled 2018-07-24: qty 0.5

## 2018-07-24 MED ORDER — DIBUCAINE (PERIANAL) 1 % EX OINT: 1.0000 "application " | TOPICAL_OINTMENT | CUTANEOUS | Status: DC | PRN

## 2018-07-24 MED ORDER — MAGNESIUM HYDROXIDE 400 MG/5ML PO SUSP: 30.0000 mL | ORAL | Status: DC | PRN

## 2018-07-24 MED ORDER — PRENATAL MULTIVITAMIN CH
1.0000 | ORAL_TABLET | Freq: Every day | ORAL | Status: DC
  Administered 2018-07-24: 1 via ORAL
  Filled 2018-07-24: qty 1

## 2018-07-24 MED ORDER — LACTATED RINGERS IV SOLN: INTRAVENOUS | Status: DC

## 2018-07-24 MED ORDER — ACETAMINOPHEN 325 MG PO TABS: 650.0000 mg | ORAL_TABLET | ORAL | Status: DC | PRN

## 2018-07-24 MED ORDER — TETANUS-DIPHTH-ACELL PERTUSSIS 5-2.5-18.5 LF-MCG/0.5 IM SUSP: 0.5000 mL | Freq: Once | INTRAMUSCULAR | Status: DC

## 2018-07-24 MED ORDER — DOCUSATE SODIUM 100 MG PO CAPS
100.0000 mg | ORAL_CAPSULE | Freq: Two times a day (BID) | ORAL | Status: DC
  Administered 2018-07-25: 100 mg via ORAL
  Filled 2018-07-24: qty 1

## 2018-07-24 MED ORDER — FERROUS SULFATE 325 (65 FE) MG PO TABS
325.0000 mg | ORAL_TABLET | Freq: Two times a day (BID) | ORAL | Status: DC
  Administered 2018-07-24 – 2018-07-25 (×3): 325 mg via ORAL
  Filled 2018-07-24 (×3): qty 1

## 2018-07-24 MED ORDER — IBUPROFEN 600 MG PO TABS
600.0000 mg | ORAL_TABLET | Freq: Four times a day (QID) | ORAL | Status: DC
  Administered 2018-07-24 – 2018-07-25 (×4): 600 mg via ORAL
  Filled 2018-07-24 (×4): qty 1

## 2018-07-24 MED ORDER — HYDROMORPHONE HCL 1 MG/ML IJ SOLN
0.4000 mg | Freq: Once | INTRAMUSCULAR | Status: AC
  Administered 2018-07-24: 0.4 mg via INTRAVENOUS

## 2018-07-24 MED ORDER — ONDANSETRON HCL 4 MG PO TABS: 4.0000 mg | ORAL_TABLET | ORAL | Status: DC | PRN

## 2018-07-24 MED ORDER — COCONUT OIL OIL
1.0000 "application " | TOPICAL_OIL | Status: DC | PRN
  Filled 2018-07-24: qty 120

## 2018-07-24 MED ORDER — ONDANSETRON HCL 4 MG/2ML IJ SOLN: 4.0000 mg | INTRAMUSCULAR | Status: DC | PRN

## 2018-07-24 MED ORDER — ZOLPIDEM TARTRATE 5 MG PO TABS: 5.0000 mg | ORAL_TABLET | Freq: Every evening | ORAL | Status: DC | PRN

## 2018-07-24 MED ORDER — METHYLERGONOVINE MALEATE 0.2 MG/ML IJ SOLN
INTRAMUSCULAR | Status: AC
  Administered 2018-07-24: 0.2 mg via INTRAMUSCULAR
  Filled 2018-07-24: qty 1

## 2018-07-24 MED ORDER — CARBOPROST TROMETHAMINE 250 MCG/ML IM SOLN
INTRAMUSCULAR | Status: AC
  Filled 2018-07-24: qty 1

## 2018-07-24 MED ORDER — BENZOCAINE-MENTHOL 20-0.5 % EX AERO: 1.0000 "application " | INHALATION_SPRAY | CUTANEOUS | Status: DC | PRN

## 2018-07-24 NOTE — Discharge Instructions (Signed)
Vaginal Delivery, Care After °Refer to this sheet in the next few weeks. These instructions provide you with information about caring for yourself after vaginal delivery. Your health care provider may also give you more specific instructions. Your treatment has been planned according to current medical practices, but problems sometimes occur. Call your health care provider if you have any problems or questions. °What can I expect after the procedure? °After vaginal delivery, it is common to have: °· Some bleeding from your vagina. °· Soreness in your abdomen, your vagina, and the area of skin between your vaginal opening and your anus (perineum). °· Pelvic cramps. °· Fatigue. °Follow these instructions at home: °Medicines °· Take over-the-counter and prescription medicines only as told by your health care provider. °· If you were prescribed an antibiotic medicine, take it as told by your health care provider. Do not stop taking the antibiotic until it is finished. °Driving ° °· Do not drive or operate heavy machinery while taking prescription pain medicine. °· Do not drive for 24 hours if you received a sedative. °Lifestyle °· Do not drink alcohol. This is especially important if you are breastfeeding or taking medicine to relieve pain. °· Do not use tobacco products, including cigarettes, chewing tobacco, or e-cigarettes. If you need help quitting, ask your health care provider. °Eating and drinking °· Drink at least 8 eight-ounce glasses of water every day unless you are told not to by your health care provider. If you choose to breastfeed your baby, you may need to drink more water than this. °· Eat high-fiber foods every day. These foods may help prevent or relieve constipation. High-fiber foods include: °? Whole grain cereals and breads. °? Brown rice. °? Beans. °? Fresh fruits and vegetables. °Activity °· Return to your normal activities as told by your health care provider. Ask your health care provider what  activities are safe for you. °· Rest as much as possible. Try to rest or take a nap when your baby is sleeping. °· Do not lift anything that is heavier than your baby or 10 lb (4.5 kg) until your health care provider says that it is safe. °· Talk with your health care provider about when you can engage in sexual activity. This may depend on your: °? Risk of infection. °? Rate of healing. °? Comfort and desire to engage in sexual activity. °Vaginal Care °· If you have an episiotomy or a vaginal tear, check the area every day for signs of infection. Check for: °? More redness, swelling, or pain. °? More fluid or blood. °? Warmth. °? Pus or a bad smell. °· Do not use tampons or douches until your health care provider says this is safe. °· Watch for any blood clots that may pass from your vagina. These may look like clumps of dark red, brown, or black discharge. °General instructions °· Keep your perineum clean and dry as told by your health care provider. °· Wear loose, comfortable clothing. °· Wipe from front to back when you use the toilet. °· Ask your health care provider if you can shower or take a bath. If you had an episiotomy or a perineal tear during labor and delivery, your health care provider may tell you not to take baths for a certain length of time. °· Wear a bra that supports your breasts and fits you well. °· If possible, have someone help you with household activities and help care for your baby for at least a few days after you   leave the hospital.  Keep all follow-up visits for you and your baby as told by your health care provider. This is important. Contact a health care provider if:  You have: ? Vaginal discharge that has a bad smell. ? Difficulty urinating. ? Pain when urinating. ? A sudden increase or decrease in the frequency of your bowel movements. ? More redness, swelling, or pain around your episiotomy or vaginal tear. ? More fluid or blood coming from your episiotomy or vaginal  tear. ? Pus or a bad smell coming from your episiotomy or vaginal tear. ? A fever. ? A rash. ? Little or no interest in activities you used to enjoy. ? Questions about caring for yourself or your baby.  Your episiotomy or vaginal tear feels warm to the touch.  Your episiotomy or vaginal tear is separating or does not appear to be healing.  Your breasts are painful, hard, or turn red.  You feel unusually sad or worried.  You feel nauseous or you vomit.  You pass large blood clots from your vagina. If you pass a blood clot from your vagina, save it to show to your health care provider. Do not flush blood clots down the toilet without having your health care provider look at them.  You urinate more than usual.  You are dizzy or light-headed.  You have not breastfed at all and you have not had a menstrual period for 12 weeks after delivery.  You have stopped breastfeeding and you have not had a menstrual period for 12 weeks after you stopped breastfeeding. Get help right away if:  You have: ? Pain that does not go away or does not get better with medicine. ? Chest pain. ? Difficulty breathing. ? Blurred vision or spots in your vision. ? Thoughts about hurting yourself or your baby.  You develop pain in your abdomen or in one of your legs.  You develop a severe headache.  You faint.  You bleed from your vagina so much that you fill two sanitary pads in one hour. This information is not intended to replace advice given to you by your health care provider. Make sure you discuss any questions you have with your health care provider. Document Released: 02/02/2000 Document Revised: 07/19/2015 Document Reviewed: 02/19/2015 Elsevier Interactive Patient Education  2019 [employer]. Breastfeeding Tips for a Good Latch Latching is how your baby's mouth attaches to your nipple to breastfeed. It is an important part of breastfeeding. Your baby may have trouble latching for a number of  reasons. A poor latch may cause you to have cracked or sore nipples or other problems. Follow these instructions at home: How to position your baby  Find a comfortable place to sit or lie down. Your neck and back should be well supported.  If you are seated, place a pillow or rolled-up blanket under your baby. This will bring him or her to the level of your breast.  Make sure that your baby's belly (abdomen) is facing your belly.  Try different positions to find one that works best for you and your baby. How to help your baby latch   To start, gently rub your breast. Move your fingertips in a circle as you massage from your chest wall toward your nipple. This helps milk flow. Keep doing this during feeding if needed.  Position your breast. Hold your breast with four fingers underneath and your thumb above your nipple. Keep your fingers away from your nipple and your baby's mouth.  Follow these steps to help your baby latch: 1. Rub your baby's lips gently with your finger or nipple. 2. When your baby's mouth is open wide enough, quickly bring your baby to your breast and place your whole nipple into your baby's mouth. Place as much of the colored area around your nipple (areola)as possible into your baby's mouth. 3. Your baby's tongue should be between his or her lower gum and your breast. 4. You should be able to see more areola above your baby's upper lip than below the lower lip. 5. When your baby starts sucking, you will feel a gentle pull on your nipple. You should not feel any pain. Be patient. It is common for a baby to suck for about 2-3 minutes to start the flow of breast milk. 6. Make sure that your baby's mouth is in the right position around your nipple. Your baby's lips should make a seal on your breast and be turned outward.  General instructions  Look for these signs that your baby has latched on to your nipple: ? The baby is quietly tugging or sucking without causing you  pain. ? You hear the baby swallow after every 3 or 4 sucks. ? You see movement above and in front of the baby's ears while he or she is sucking.  Be aware of these signs that your baby has not latched on to your nipple: ? The baby makes sucking sounds or smacking sounds while feeding. ? You have nipple pain.  If your baby is not latched well, put your little finger between your baby's gums and your nipple. This will break the seal. Then try to help your baby latch again.  If you keep having problems, get help from a breastfeeding specialist (Advertising copywriter). Contact a doctor if:  You have cracking or soreness in your nipples that lasts longer than 1 week.  You have nipple pain.  Your breasts are filled with too much milk (engorgement), and this does not improve after 48-72 hours.  You have a plugged milk duct and a fever.  You follow the tips for a good latch but you keep having problems or concerns.  You have a pus-like fluid coming from your breast.  Your baby is not gaining weight.  Your baby loses weight. Summary  Latching is how your baby's mouth attaches to your nipple to breastfeed.  Try different positions for breastfeeding to find one that works best for you and your baby.  A poor latch may cause you to have cracked or sore nipples or other problems. This information is not intended to replace advice given to you by your health care provider. Make sure you discuss any questions you have with your health care provider. Document Released: 09/11/2016 Document Revised: 09/11/2016 Document Reviewed: 09/11/2016 Elsevier Interactive Patient Education  2019 [employer]. Breastfeeding and Self-Care It is normal to have some problems when you start to breastfeed your new baby. But there are things that you can do to take care of yourself and help prevent many common problems. This includes keeping your breasts healthy and making sure that your baby's mouth attaches  (latches) properly to your nipple for feedings. Work with your doctor or breastfeeding specialist (Advertising copywriter) to find what works best for you. Follow these instructions at home: Breastfeeding strategy   Always make sure that your baby latches properly to breastfeed.  Make sure that your baby is in a proper position. Try different breastfeeding positions to find one that works  best for you and your baby.  Breastfeed when you feel like you need to make your breasts less full or when your baby shows signs of hunger. This is called "breastfeeding on demand."  Do not delay feedings.  Try to relax when it is time to feed your baby. This helps your body release milk from your breast.  To help increase milk flow: ? Remove a small amount of milk from your breast right before breastfeeding. Do this using a pump or by squeezing with your hand. ? Apply warm, moist heat to your breast right before feeding. You can do this in the shower or with hand towels soaked with warm water. ? Massage your breast right before or during feeding. Breast care   To help your breasts stay healthy and keep them from getting too dry: ? Avoid using soap on your nipples. ? Let your nipples air-dry for 3-4 minutes after each feeding. ? Use only cotton bra pads to soak up breast milk that leaks. Be sure to change the pads if they become soaked with milk. If you use bra pads that can be thrown away, change them often. ? Put some lanolin on your nipples after breastfeeding. Pure lanolin does not need to be washed off your nipple before you feed your baby again. Pure lanolin is not harmful to your baby. ? Rub some breast milk into your nipples: ? Use your hand to squeeze out a few drops of breast milk. ? Gently massage the milk into your nipples. ? Let your nipples air-dry.  Wear a supportive nursing bra. Avoid wearing: ? Tight clothing. ? Underwire bras or bras that put pressure on your breasts.  Use ice to  help relieve pain or swelling of your breasts: ? Put ice in a plastic bag. ? Place a towel between your skin and the bag. ? Leave the ice on for 20 minutes, 2-3 times a day. General instructions  Drink enough fluid to keep your pee (urine) pale yellow.  Get plenty of rest. Sleep when your baby sleeps.  Talk to your doctor or breastfeeding specialist before taking any herbal supplements. Contact a health care provider if:  You have nipple pain.  You have cracking or soreness in your nipples that lasts longer than 1 week.  Your breasts are overfilled with milk (engorgement) and this lasts longer than 48 hours.  You have a fever.  You have pus-like fluid coming from your nipple.  You have redness, a rash, swelling, itching, or burning on your breast.  Your baby does not gain weight.  Your baby loses weight. Summary  There are things that you can do to take care of yourself and help prevent many common breastfeeding problems.  Always make sure that your baby's mouth attaches (latches) to your nipple properly to breastfeed.  Keep your nipples from getting too dry, drink plenty of fluid, and get plenty of rest.  Feed on demand. Do not delay feedings. This information is not intended to replace advice given to you by your health care provider. Make sure you discuss any questions you have with your health care provider. Document Released: 09/11/2016 Document Revised: 09/11/2016 Document Reviewed: 09/11/2016 Elsevier Interactive Patient Education  2019 [employer] Breastfeeding and Low Milk Supply It is normal to have some problems when you start to breastfeed your new baby. One problem is having a low amount of breast milk. If you have a low milk supply, this may cause your baby to not gain enough  weight. Making sure your breasts are emptied during feedings can help prevent a low milk supply. Follow these instructions at home: When to breastfeed your baby  Breastfeed when you  feel like you need to reduce the fullness of your breasts or when your baby shows signs of hunger. This is called "breastfeeding on demand."  Do not delay feedings. Feed your baby often. General instructions   Try to empty your breasts of milk at each feeding. This will cause them to make more milk.  If your breast is not empty after a feeding, use a pump or squeeze with your hand (hand express) to get the rest of the milk out.  Make sure your baby's mouth attaches to your nipple (latches) properly when breastfeeding.  Make sure your baby is in the right position when breastfeeding. Try different positions to find one that helps your baby feed better.  Do not give your baby extra formula unless your doctor or breastfeeding specialist (lactation consultant) tells you to do that. Medicines  Let your doctor know what over-the-counter or prescription medicines you are taking. Some medicines may affect how much milk you make.  Talk to your doctor or breastfeeding specialist before you take any herbal supplements. Contact a doctor if:  Your baby does not gain weight.  Your baby loses weight.  You continue to have a low milk supply. Summary  If you have a low milk supply, this may cause your baby to not gain enough weight.  Feed your baby often. Do not delay feedings.  Try to empty your breasts of milk at each feeding. Use a pump or squeeze with your hand (hand express) to get remaining milk out after a feeding. This information is not intended to replace advice given to you by your health care provider. Make sure you discuss any questions you have with your health care provider. Document Released: 09/11/2016 Document Revised: 09/11/2016 Document Reviewed: 09/11/2016 Elsevier Interactive Patient Education  2019 [employer]. Breast Engorgement Breast engorgement is the overfilling of your breasts with breast milk. It is usually caused by delaying feedings, which can cause milk to  build up. Breast engorgement can happen at any time while you are breast feeding, and is normal in the first 3-5 days after giving birth. The condition can make your breasts feel heavy, full, hard, tightly stretched, warm, and tender. Breast engorgement should improve within 24-48 hours of feeding your baby or expressing your milk. Follow these instructions at home: When to breastfeed or pump  Breastfeed when your baby shows signs of hunger. This is called "breastfeeding on demand."  Breastfeed or use a breast pump to remove milk from your breasts when you feel the need to reduce the fullness of your breasts.  If your baby is younger than 1 month, make sure you are breastfeeding every 1-3 hours during the day. You may need to wake up your baby to feed if he or she is asleep at a feeding time.  Do not allow your baby to sleep longer than 5 hours during the night without a feeding.  Do not delay feedings.  If you are returning to work or are away from home for an extended period, try to pump your milk on the same schedule as when your baby would breastfeed. Before breastfeeding or pumping:  Increase the circulation in your breasts and help your milk flow. Try either of these methods: ? Taking a warm shower. ? Applying warm, water-soaked hand towels to your breasts. ?  Massaging your breasts.  Pump or hand-express breast milk before breastfeeding to soften your breast, areola, and nipple. During breastfeeding or pumping:  Try to relax when it is time to feed your baby. This helps to trigger your "let-down reflex," which releases milk from your breast.  Ensure your baby is latched on to your breast and positioned properly while breastfeeding.  Empty your breasts completely when breastfeeding or pumping.  Allow your baby to remain at your breast as long as he or she is latched on well and sucking. Your baby will let you know when he or she is done breastfeeding by pulling away from your  breast or falling asleep.  Massage your breasts to help your milk flow. Managing pain and swelling   Take over-the-counter and prescription medicines only as told by your health care provider.  If directed, put ice on your breasts: ? Put ice in a plastic bag. ? Place a towel between your skin and the bag. ? Leave the ice on for 20 minutes, 2-3 times a day.  If you feel pain while breastfeeding, take your baby off your breast and try again. General instructions  After breastfeeding or pumping wear a snug bra or tank top for 1-2 days. This will signal your body to slightly decrease how much milk it makes. Once the engorgement passes, make sure you to wear a well-fitted, supportive bra and regular clothes.  Drink enough fluid to keep your urine clear or pale yellow.  Avoid introducing bottles or pacifiers to your baby in the early weeks of breastfeeding. Wait to introduce these things until after resolving any breastfeeding challenges. Contact a health care provider if:  Engorgement lasts longer than 2 days, even after treatment.  You have flu-like symptoms, such as a fever, chills, or body aches.  You have nausea or you vomit.  Your breasts become red and painful.  You have a lump in your breast.  Your nipples continue to crack or start to ooze.  There is yellow discharge coming from a nipple.  You have pain while breastfeeding, and it does not go away once you take your baby off your breast and try again. Get help right away if:  There is pus or blood in your breast milk.  You have sudden, severe symptoms.  You have red streaks near your breast.  Both breasts appear infected and you cannot breastfeed. Summary  Breast engorgement is the overfilling of your breasts with breast milk. It is usually caused by delayed feeding.  Although it is normal to experience breast engorgement 3-5 days after giving birth, it can happen at any time while breastfeeding.  Do not delay  feedings. Breastfeed on demand to help prevent engorgement.  Increase the circulation in your breasts and help your milk flow before feeding your baby. You can do this by taking a warm shower, applying warm water-soaked hand towels, or massaging your breasts. This information is not intended to replace advice given to you by your health care provider. Make sure you discuss any questions you have with your health care provider. Document Released: 06/01/2004 Document Revised: 03/11/2016 Document Reviewed: 03/11/2016 Elsevier Interactive Patient Education  2019 [employer].

## 2018-07-24 NOTE — Discharge Summary (Signed)
OB Discharge Summary     Patient Name: Melissa Barr DOB: [birth year] MRN: [id number]  Date of admission: 07/23/2018 Delivering MD: Natale Milch, MD  Date of Delivery: 07/24/2018  Date of discharge: 07/25/2018  Admitting diagnosis: 1 Intrauterine pregnancy: [redacted]w[redacted]d     Secondary diagnosis: None     Discharge diagnosis: Term Pregnancy Delivered, No other diagnosis                         Hospital course:  Onset of Labor With Vaginal Delivery     23 y.o. yo G1P0000 at [redacted]w[redacted]d was admitted in Latent Labor on 07/23/2018. Patient had an uncomplicated labor course as follows:  Membrane Rupture Time/Date: 9:21 PM ,07/23/2018   Intrapartum Procedures: Episiotomy: None [1]                                         Lacerations:     Patient had a delivery of a Viable infant. 07/24/2018  Information for the patient's newborn:  Nilka, Kolo [[id number]]  Delivery Method: Vag-Spont    Pateint had an uncomplicated postpartum course.  She is ambulating, tolerating a regular diet, passing flatus, and urinating well. Patient is discharged home in stable condition on 07/25/18.                                                                  Post partum procedures:none  Complications: None  Physical exam on 07/25/2018: Vitals:   07/24/18 0907 07/24/18 1213 07/24/18 2354 07/25/18 0838  BP: 122/72 113/62 108/65 108/63  Pulse: 100  94 90  Resp: 18 20 18 18   Temp: 98.4 F (36.9 C) 98.2 F (36.8 C) 97.9 F (36.6 C) 98.1 F (36.7 C)  TempSrc: Oral Oral Oral Oral  SpO2: 100%  100% 100%  Weight:      Height:       General: alert, cooperative and no distress Lochia: appropriate Uterine Fundus: firm Incision: N/A DVT Evaluation: No evidence of DVT seen on physical exam. Negative Homan's sign.  Labs: Lab Results  Component Value Date   WBC 14.4 (H) 07/25/2018   HGB 8.6 (L) 07/25/2018   HCT 27.0 (L) 07/25/2018   MCV 92.8 07/25/2018   PLT 195 07/25/2018   CMP Latest Ref Rng & Units  06/21/2014  Glucose 65 - 99 mg/dL 314(H)  BUN 6 - 20 mg/dL 20  Creatinine 8.88 - 7.57 mg/dL 9.72  Sodium 820 - 601 mmol/L 135  Potassium 3.5 - 5.1 mmol/L 3.9  Chloride 101 - 111 mmol/L 106  CO2 22 - 32 mmol/L 21(L)  Calcium 8.9 - 10.3 mg/dL 5.6(F)  Total Protein 6.5 - 8.1 g/dL 7.0  Total Bilirubin 0.3 - 1.2 mg/dL 0.6  Alkaline Phos 47 - 119 U/L 40(L)  AST 15 - 41 U/L 18  ALT 14 - 54 U/L 10(L)    Discharge instruction: per After Visit Summary.  Medications:  Allergies as of 07/25/2018      Reactions   Sulfa Antibiotics Shortness Of Breath   rash      Medication List    TAKE these medications   ferrous sulfate 325 (  65 FE) MG tablet Take 1 tablet (325 mg total) by mouth 2 (two) times daily with a meal.   prenatal multivitamin Tabs tablet Take 1 tablet by mouth daily at 12 noon.   triamcinolone ointment 0.1 % Commonly known as:  KENALOG Apply 1 application topically 2 (two) times daily.       Diet: routine diet  Activity: Advance as tolerated. Pelvic rest for 6 weeks.   Outpatient follow up: Follow-up Information    Schuman, Christanna R, MD Follow up in 6 week(s).   Specialty:  Obstetrics and Gynecology Contact information: [address] [place] [address] [phone]             Postpartum contraception: Progesterone only pills Rhogam Given postpartum: yes Rubella vaccine given postpartum: no Varicella vaccine given postpartum: no TDaP given antepartum or postpartum: Yes  Newborn Data: Live born female  Birth Weight:   APGAR: 9, 9  Newborn Delivery   Birth date/time:  07/24/2018 00:49:00 Delivery type:  Vaginal, Spontaneous      Baby Feeding: Breast  Disposition:home with mother  SIGNED: Letitia Libraobert Paul Lashaun Krapf, MD 07/25/2018 9:57 AM

## 2018-07-24 NOTE — Progress Notes (Signed)
Post Partum Day 0 Subjective: voiding and tolerating PO. Felt a little lightheaded when OOB this AM to BR  Objective: Blood pressure 122/72, pulse 100, temperature 98.4 F (36.9 C), temperature source Oral, resp. rate 18, height _0  (1.575 m), weight 73.9 kg, last menstrual period 10/28/2017, SpO2 100 %.  Physical Exam:  General: alert, cooperative and no distress Lochia: appropriate Uterine Fundus: firm/ ML/NT U-1 Vulva: mild edema of labia but no hematoma seen DVT Evaluation: No evidence of DVT seen on physical exam.  Recent Labs    07/23/18 1616 07/24/18 0530  HGB 11.1* 10.5*  HCT 34.0* 32.0*  WBC 21.7* 31.3*  PLT 248 252   Information for the patient's newborn:  Isobelle, Tuckett [[id number]]  A POS   Assessment/Plan: PPD 0 (9hours pp)-stable Mild anemia with some lightheadedness this AM-OOB with assist  Iron and vitamins A neg, Baby A pos-needs Rhogam MMR x2, Varivax x2 Breast-support breast feeding Contraception: pills  LOS: 1 day   Dalia Heading 07/24/2018, 10:25 AM

## 2018-07-25 LAB — CBC
HCT: 27 % — ABNORMAL LOW (ref 36.0–46.0)
Hemoglobin: 8.6 g/dL — ABNORMAL LOW (ref 12.0–15.0)
MCH: 29.6 pg (ref 26.0–34.0)
MCHC: 31.9 g/dL (ref 30.0–36.0)
MCV: 92.8 fL (ref 80.0–100.0)
Platelets: 195 10*3/uL (ref 150–400)
RBC: 2.91 MIL/uL — ABNORMAL LOW (ref 3.87–5.11)
RDW: 12.6 % (ref 11.5–15.5)
WBC: 14.4 10*3/uL — ABNORMAL HIGH (ref 4.0–10.5)
nRBC: 0 % (ref 0.0–0.2)

## 2018-07-25 LAB — RPR: RPR Ser Ql: NONREACTIVE

## 2018-07-25 LAB — FETAL SCREEN: Fetal Screen: NEGATIVE

## 2018-07-25 MED ORDER — FERROUS SULFATE 325 (65 FE) MG PO TABS: 325.0000 mg | ORAL_TABLET | Freq: Two times a day (BID) | ORAL | 1 refills | Status: DC

## 2018-07-25 MED ORDER — RHO D IMMUNE GLOBULIN 1500 UNIT/2ML IJ SOSY
300.0000 ug | PREFILLED_SYRINGE | Freq: Once | INTRAMUSCULAR | Status: AC
  Administered 2018-07-25: 11:00:00 300 ug via INTRAMUSCULAR
  Filled 2018-07-25: qty 2

## 2018-07-25 MED ORDER — NORETHINDRONE 0.35 MG PO TABS: 1.0000 | ORAL_TABLET | Freq: Every day | ORAL | 11 refills | Status: DC

## 2018-07-25 NOTE — Progress Notes (Signed)
Discharge instructions given. Patient verbalizes understanding of teaching. Patient discharged home via wheelchair at 1120.

## 2018-07-26 LAB — RHOGAM INJECTION: Unit division: 0

## 2018-08-07 ENCOUNTER — Telehealth: Payer: Self-pay

## 2018-08-07 NOTE — Telephone Encounter (Signed)
FMLA/DISABILITY form for [employer] filled out, signature obtained, and given to KT for processing. 

## 2018-08-28 ENCOUNTER — Telehealth: Payer: Self-pay | Admitting: Physician Assistant

## 2018-08-28 NOTE — Telephone Encounter (Signed)
Pt gave birth 5 weeks ago.  For 3 weeks pt has had vaginal odor and itchy - yeast infection symtoms. Wants to know if she can use over the counter medication.  Please advise.  Thanks, [employer]

## 2018-08-28 NOTE — Telephone Encounter (Signed)
Yes this is ok. It is recommended to use the Monistat 7 day, not 1 or 3

## 2018-08-28 NOTE — Telephone Encounter (Signed)
Please advise 

## 2018-08-28 NOTE — Telephone Encounter (Signed)
Patient was advised.  

## 2018-09-08 ENCOUNTER — Telehealth: Payer: Self-pay | Admitting: Obstetrics and Gynecology

## 2018-09-08 ENCOUNTER — Telehealth: Payer: Self-pay

## 2018-09-08 NOTE — Telephone Encounter (Signed)
Left message for pt regarding her FMLA forms. Patient needs to come by office to fill out forms and pay the fee if FMLA is still needed.

## 2018-09-08 NOTE — Telephone Encounter (Signed)
Will you call pt and let her know its because we dont have payment or the two cover sheet we ask for. Kenney Houseman has already tried to call her and let her know that.

## 2018-09-08 NOTE — Telephone Encounter (Signed)
Pt aware.

## 2018-09-08 NOTE — Telephone Encounter (Addendum)
Pt calling to see why her FMLA leave was denied for her job.  [phone]

## 2018-09-25 ENCOUNTER — Ambulatory Visit (INDEPENDENT_AMBULATORY_CARE_PROVIDER_SITE_OTHER): Payer: BC Managed Care – PPO | Admitting: Physician Assistant

## 2018-09-25 ENCOUNTER — Encounter: Payer: Self-pay | Admitting: Physician Assistant

## 2018-09-25 ENCOUNTER — Telehealth: Payer: Self-pay | Admitting: Physician Assistant

## 2018-09-25 VITALS — Temp 98.1°F | Wt 130.0 lb

## 2018-09-25 DIAGNOSIS — T3695XA Adverse effect of unspecified systemic antibiotic, initial encounter: Secondary | ICD-10-CM | POA: Diagnosis not present

## 2018-09-25 DIAGNOSIS — N3000 Acute cystitis without hematuria: Secondary | ICD-10-CM | POA: Diagnosis not present

## 2018-09-25 DIAGNOSIS — B379 Candidiasis, unspecified: Secondary | ICD-10-CM | POA: Diagnosis not present

## 2018-09-25 MED ORDER — NITROFURANTOIN MONOHYD MACRO 100 MG PO CAPS: 100.0000 mg | ORAL_CAPSULE | Freq: Two times a day (BID) | ORAL | 0 refills | Status: DC

## 2018-09-25 MED ORDER — FLUCONAZOLE 150 MG PO TABS: 150.0000 mg | ORAL_TABLET | Freq: Once | ORAL | 0 refills | Status: AC

## 2018-09-25 NOTE — Progress Notes (Signed)
Patient: Melissa BentonSamantha J Armenteros Female    DOB: 1995-09-27   22 y.o.   MRN: [id number] Visit Date: 09/25/2018  Today's Provider: Margaretann LovelessJennifer M Burnette, PA-C   Chief Complaint  Patient presents with  . Dysuria   Subjective:    I,Joseline E. Rosas,RMA am acting as a Neurosurgeonscribe for PPG IndustriesJennifer M. Burnette, PA-C.  Virtual Visit via Video Note  I connected with Melissa Barr on 09/25/18 at 10:40 AM EDT by a video enabled telemedicine application and verified that I am speaking with the correct person using two identifiers.  Location: Patient: Home Provider: [facility]   I discussed the limitations of evaluation and management by telemedicine and the availability of in person appointments. The patient expressed understanding and agreed to proceed.   Dysuria  This is a new problem. The current episode started yesterday. The problem occurs every urination. The problem has been gradually worsening. The quality of the pain is described as burning and aching. The pain is mild. There has been no fever. She is sexually active. Associated symptoms include frequency. Pertinent negatives include no flank pain or hematuria. Treatments tried: azo. The treatment provided mild relief. Her past medical history is significant for recurrent UTIs.      Allergies  Allergen Reactions  . Sulfa Antibiotics Shortness Of Breath    rash     Current Outpatient Medications:  .  triamcinolone ointment (KENALOG) 0.1 %, Apply 1 application topically 2 (two) times daily., Disp: 30 g, Rfl: 0 .  norethindrone (MICRONOR) 0.35 MG tablet, Take 1 tablet (0.35 mg total) by mouth daily. Start in 2 weeks (Patient not taking: Reported on 09/25/2018), Disp: 1 Package, Rfl: 11  Review of Systems  Constitutional: Negative.   Respiratory: Negative.   Cardiovascular: Negative.   Gastrointestinal: Positive for abdominal pain.  Genitourinary: Positive for dysuria and frequency. Negative for flank pain, hematuria, vaginal bleeding, vaginal  discharge and vaginal pain.    Social History   Tobacco Use  . Smoking status: Light Tobacco Smoker    Packs/day: 0.10    Years: 1.50    Pack years: 0.15    Types: Cigarettes    Last attempt to quit: 04/18/2016    Years since quitting: 2.4  . Smokeless tobacco: Never Used  . Tobacco comment: had stopped smoking 04/18/2016 after 1 yr and restarted 12/2016  Substance Use Topics  . Alcohol use: Not Currently    Comment: Ocassional       Objective:   Temp 98.1 F (36.7 C) (Temporal)   Wt 130 lb (59 kg)   LMP 09/22/2018   BMI 23.78 kg/m  Vitals:   09/25/18 0933  Temp: 98.1 F (36.7 C)  TempSrc: Temporal  Weight: 130 lb (59 kg)     Physical Exam Vitals signs reviewed.  Constitutional:      General: She is not in acute distress.    Appearance: Normal appearance. She is well-developed.  HENT:     Head: Normocephalic and atraumatic.  Neck:     Musculoskeletal: Normal range of motion and neck supple.  Pulmonary:     Effort: Pulmonary effort is normal. No respiratory distress.  Neurological:     Mental Status: She is alert.  Psychiatric:        Mood and Affect: Mood normal.        Behavior: Behavior normal.        Thought Content: Thought content normal.        Judgment: Judgment normal.  No results found for any visits on 09/25/18.     Assessment & Plan     1. Acute cystitis without hematuria Will treat with Macrobid 100mg  BID x 7 days. Call if not improving.  2. Antibiotic-induced yeast infection Gets yeast infections with antibiotics. Diflucan given as below.  - fluconazole (DIFLUCAN) 150 MG tablet; Take 1 tablet (150 mg total) by mouth once for 1 dose.  Dispense: 1 tablet; Refill: 0   I discussed the assessment and treatment plan with the patient. The patient was provided an opportunity to ask questions and all were answered. The patient agreed with the plan and demonstrated an understanding of the instructions.   The patient was advised to call back  or seek an in-person evaluation if the symptoms worsen or if the condition fails to improve as anticipated.  I provided 13 minutes of non-face-to-face time during this encounter.    Mar Daring, PA-C  [facility]

## 2018-09-25 NOTE — Telephone Encounter (Signed)
Pt returned missed call.  Please call pt back. ° °Thanks, °[facility] °

## 2018-09-25 NOTE — Telephone Encounter (Signed)
Spoke with patient.

## 2018-10-07 ENCOUNTER — Telehealth: Payer: Self-pay | Admitting: Family Medicine

## 2018-10-07 NOTE — Telephone Encounter (Signed)
Phone call to pt. Left message on voice mail that RN with [facility] is trying to return call, if still need assistance, please call [phone].

## 2018-10-08 NOTE — Telephone Encounter (Signed)
Returned patient phone call x2. No answer, LMTC. Jameis Newsham, RN  

## 2018-10-09 NOTE — Telephone Encounter (Signed)
Returned phone call to patient. Patient requesting an appt for a Nexplanon placement. Patient states that she has had a Nexplanon previously. Patient reports last sex was one week ago. RN counseled to not have sex until after Nexplanon placement. Patient verbalized understanding of same. Nexplanon consult completed. Patient scheduled for Nexplanon placement for 10/15/2018 @ 3:00. Instructed to arrive at 2:40 for check in. Hal Morales, RN

## 2018-10-13 ENCOUNTER — Encounter: Payer: Self-pay | Admitting: Physician Assistant

## 2018-10-13 ENCOUNTER — Ambulatory Visit (INDEPENDENT_AMBULATORY_CARE_PROVIDER_SITE_OTHER): Payer: BC Managed Care – PPO | Admitting: Physician Assistant

## 2018-10-13 ENCOUNTER — Other Ambulatory Visit: Payer: Self-pay

## 2018-10-13 VITALS — BP 120/76 | HR 78 | Temp 97.3°F | Resp 16 | Wt 133.0 lb

## 2018-10-13 DIAGNOSIS — R300 Dysuria: Secondary | ICD-10-CM | POA: Diagnosis not present

## 2018-10-13 DIAGNOSIS — N3090 Cystitis, unspecified without hematuria: Secondary | ICD-10-CM

## 2018-10-13 DIAGNOSIS — N898 Other specified noninflammatory disorders of vagina: Secondary | ICD-10-CM | POA: Diagnosis not present

## 2018-10-13 LAB — POCT URINALYSIS DIPSTICK
Bilirubin, UA: NEGATIVE
Blood, UA: NEGATIVE
Glucose, UA: NEGATIVE
Ketones, UA: NEGATIVE
Nitrite, UA: POSITIVE
Protein, UA: NEGATIVE
Spec Grav, UA: 1.01 (ref 1.010–1.025)
Urobilinogen, UA: 0.2 E.U./dL
pH, UA: 6 (ref 5.0–8.0)

## 2018-10-13 MED ORDER — AMOXICILLIN-POT CLAVULANATE 875-125 MG PO TABS: 1.0000 | ORAL_TABLET | Freq: Two times a day (BID) | ORAL | 0 refills | Status: DC

## 2018-10-13 MED ORDER — AMOXICILLIN-POT CLAVULANATE 875-125 MG PO TABS: 1.0000 | ORAL_TABLET | Freq: Two times a day (BID) | ORAL | 0 refills | Status: AC

## 2018-10-13 MED ORDER — FLUCONAZOLE 150 MG PO TABS: ORAL_TABLET | ORAL | 0 refills | Status: DC

## 2018-10-13 NOTE — Progress Notes (Signed)
Patient: Melissa Barr Female    DOB: 23-May-1995   23 y.o.   MRN: [id number] Visit Date: 10/13/2018  Today's Provider: Trinna Post, PA-C   Chief Complaint  Patient presents with  . Urinary Tract Infection   Subjective:     HPI Urinary Tract Infection: Patient complains of burning with urination She has had symptoms for 2 days. Patient also complains of vaginal discharge. Patient denies back pain and fever. Patient does have a history of recurrent UTI.  Patient does have a history of pyelonephritis. She was treated for UTI on 09/25/2018 with macrobid during virtual visit, no urine specimen. She reports she feels symptomatic again.   Allergies  Allergen Reactions  . Sulfa Antibiotics Shortness Of Breath    rash    No current outpatient medications on file.  Review of Systems  Social History   Tobacco Use  . Smoking status: Light Tobacco Smoker    Packs/day: 0.10    Years: 1.50    Pack years: 0.15    Types: Cigarettes    Last attempt to quit: 04/18/2016    Years since quitting: 2.4  . Smokeless tobacco: Never Used  . Tobacco comment: had stopped smoking 04/18/2016 after 1 yr and restarted 12/2016  Substance Use Topics  . Alcohol use: Not Currently    Comment: Ocassional       Objective:   BP 120/76 (BP Location: Left Arm, Patient Position: Sitting, Cuff Size: Normal)   Pulse 78   Temp (!) 97.3 F (36.3 C) (Temporal)   Resp 16   Wt 133 lb (60.3 kg)   LMP 09/22/2018   BMI 24.33 kg/m  Vitals:   10/13/18 1554  BP: 120/76  Pulse: 78  Resp: 16  Temp: (!) 97.3 F (36.3 C)  TempSrc: Temporal  Weight: 133 lb (60.3 kg)     Physical Exam Constitutional:      General: She is not in acute distress.    Appearance: Normal appearance. She is well-developed. She is not diaphoretic.  Cardiovascular:     Rate and Rhythm: Normal rate and regular rhythm.  Pulmonary:     Effort: Pulmonary effort is normal.     Breath sounds: Normal breath sounds.   Abdominal:     General: Bowel sounds are normal. There is no distension.     Palpations: Abdomen is soft.     Tenderness: There is no abdominal tenderness. There is no guarding or rebound.  Skin:    General: Skin is warm and dry.  Neurological:     Mental Status: She is alert and oriented to person, place, and time. Mental status is at baseline.  Psychiatric:        Mood and Affect: Mood normal.        Behavior: Behavior normal.      Results for orders placed or performed in visit on 10/13/18  POCT urinalysis dipstick  Result Value Ref Range   Color, UA yellow    Clarity, UA clear    Glucose, UA Negative Negative   Bilirubin, UA Negative    Ketones, UA Negative    Spec Grav, UA 1.010 1.010 - 1.025   Blood, UA Negative    pH, UA 6.0 5.0 - 8.0   Protein, UA Negative Negative   Urobilinogen, UA 0.2 0.2 or 1.0 E.U./dL   Nitrite, UA Positive    Leukocytes, UA Large (3+) (A) Negative       Assessment & Plan  1. Dysuria  - POCT urinalysis dipstick - Urine Culture - fluconazole (DIFLUCAN) 150 MG tablet; Take 1 pill on day 1, then if still having symptoms, another pill 3 days later.  Dispense: 1 tablet; Refill: 0  2. Cystitis  - amoxicillin-clavulanate (AUGMENTIN) 875-125 MG tablet; Take 1 tablet by mouth 2 (two) times daily for 7 days.  Dispense: 14 tablet; Refill: 0  3. Vaginal discharge  - fluconazole (DIFLUCAN) 150 MG tablet; Take 1 pill on day 1, then if still having symptoms, another pill 3 days later.  Dispense: 1 tablet; Refill: 0  The entirety of the information documented in the History of Present Illness, Review of Systems and Physical Exam were personally obtained by me. Portions of this information were initially documented by Rondel BatonSulibeya Dimas, CMA and reviewed by me for thoroughness and accuracy.       Trey SailorsAdriana M Pollak, PA-C  [facility]

## 2018-10-13 NOTE — Patient Instructions (Signed)

## 2018-10-15 ENCOUNTER — Other Ambulatory Visit: Payer: Self-pay

## 2018-10-15 ENCOUNTER — Ambulatory Visit: Payer: Medicaid Other

## 2018-10-15 DIAGNOSIS — Z91410 Personal history of adult physical and sexual abuse: Secondary | ICD-10-CM

## 2018-10-15 LAB — URINE CULTURE

## 2018-10-21 ENCOUNTER — Ambulatory Visit: Payer: Medicaid Other

## 2018-11-11 ENCOUNTER — Ambulatory Visit (LOCAL_COMMUNITY_HEALTH_CENTER): Payer: BC Managed Care – PPO | Admitting: Family Medicine

## 2018-11-11 ENCOUNTER — Other Ambulatory Visit: Payer: Self-pay

## 2018-11-11 ENCOUNTER — Other Ambulatory Visit: Payer: Self-pay | Admitting: Family Medicine

## 2018-11-11 VITALS — BP 105/73 | Ht 62.0 in | Wt 127.6 lb

## 2018-11-11 DIAGNOSIS — Z3009 Encounter for other general counseling and advice on contraception: Secondary | ICD-10-CM

## 2018-11-11 DIAGNOSIS — Z30011 Encounter for initial prescription of contraceptive pills: Secondary | ICD-10-CM | POA: Diagnosis not present

## 2018-11-11 LAB — PREGNANCY, URINE: Preg Test, Ur: NEGATIVE

## 2018-11-11 MED ORDER — NORGESTIMATE-ETH ESTRADIOL 0.25-35 MG-MCG PO TABS: 1.0000 | ORAL_TABLET | Freq: Every day | ORAL | 0 refills | Status: DC

## 2018-11-11 NOTE — Progress Notes (Signed)
   [place] problem visit  [place] Department  Subjective:  ANNALYNN CENTANNI is a 23 y.o. being seen today for   Chief Complaint  Patient presents with  . Contraception    Nexplanon insertion    HPI 23 yo G1 here for birth control  She would like to have the [place].  She delivered 07/28/2018.  Had unprotected sex 1 wk ago and LMP 10/30/2018.     Does the patient have a current or past history of drug use? No   No components found for: HCV]   Health Maintenance Due  Topic Date Due  . TETANUS/TDAP  10/15/2015  . CHLAMYDIA SCREENING  03/11/2018  . INFLUENZA VACCINE  09/19/2018    ROS  The following portions of the patient's history were reviewed and updated as appropriate: allergies, current medications, past family history, past medical history, past social history, past surgical history and problem list. Problem list updated.   See flowsheet for other program required questions.  Objective:   Vitals:   11/11/18 0954  BP: 105/73  Weight: 127 lb 9.6 oz (57.9 kg)  Height: 5\' 2"  (1.575 m)    Physical Exam not indicated    Assessment and Plan:  KEILANI TERRANCE is a 23 y.o. female presenting to the [facility] for a Women's Health problem visit  1. Family planning services  - Pregnancy, urine-negative  2. General counseling and advice on contraceptive management - Co. Client that she will  Be given OCPs  to start today. Co. To use condoms for backup x 1 wk.  Return to clinic 1 wk for PT,. If negative may have Nexplanon inserted.  Client agrees to this plan.  3. OCP (oral contraceptive pills) initiation  - norgestimate-ethinyl estradiol (ORTHO-CYCLEN) 0.25-35 MG-MCG tablet; Take 1 tablet by mouth daily.  Dispense: 1 Package; Refill: 0  Escript to [employer]     No follow-ups on file.  No future appointments.  Hassell Done, FNP

## 2018-11-11 NOTE — Progress Notes (Signed)
Pt has prenatal care at [facility]; delivered in June 2020. No PP exam at [facility]. Per centricity records PAP due 12/2020, Last CBE 12/2017. Last sex was approx. 1 week ago without a condom. Pt has not had a PP exam with any other providers.

## 2018-11-18 ENCOUNTER — Ambulatory Visit: Payer: Self-pay

## 2018-12-04 ENCOUNTER — Telehealth: Payer: Self-pay

## 2018-12-04 NOTE — Telephone Encounter (Signed)
There is no telephone note attached.

## 2018-12-08 NOTE — Progress Notes (Signed)
Patient: Melissa Barr Female    DOB: Jan 27, 1996   23 y.o.   MRN: [id number] Visit Date: 12/09/2018  Today's Provider: Mar Daring, PA-C   No chief complaint on file.  Subjective:     HPI  Patient here with c/o discharge. Discharge has an odor. Discharge has been present x 2-3 months. Changes in frequency and amount. She reports having normal menstrual cycles x 3 months. Had daughter in June 2020. She is sexually active with protection (condoms). Stopped OCP from health department and never followed up to have Nexplanon placed.   Allergies  Allergen Reactions  . Sulfa Antibiotics Shortness Of Breath    rash     Current Outpatient Medications:  .  fluconazole (DIFLUCAN) 150 MG tablet, Take 1 pill on day 1, then if still having symptoms, another pill 3 days later. (Patient not taking: Reported on 11/11/2018), Disp: 1 tablet, Rfl: 0 .  norgestimate-ethinyl estradiol (ORTHO-CYCLEN) 0.25-35 MG-MCG tablet, Take 1 tablet by mouth daily. (Patient not taking: Reported on 12/09/2018), Disp: 1 Package, Rfl: 0  Review of Systems  Constitutional: Negative.   Respiratory: Negative.   Cardiovascular: Negative.   Gastrointestinal: Negative.   Genitourinary: Positive for vaginal discharge. Negative for dyspareunia, dysuria, menstrual problem, vaginal bleeding and vaginal pain.  Musculoskeletal: Negative for back pain.    Social History   Tobacco Use  . Smoking status: Former Smoker    Packs/day: 0.10    Years: 1.50    Pack years: 0.15    Types: Cigarettes    Quit date: 04/18/2016    Years since quitting: 2.6  . Smokeless tobacco: Never Used  . Tobacco comment: had stopped smoking 04/18/2016 after 1 yr and restarted 12/2016  Substance Use Topics  . Alcohol use: Not Currently    Comment: Ocassional       Objective:   BP 129/90 (BP Location: Left Arm, Patient Position: Sitting, Cuff Size: Large)   Pulse (!) 106   Temp 97.8 F (36.6 C) (Other (Comment))   Resp 16    Wt 124 lb 6.4 oz (56.4 kg)   BMI 22.75 kg/m  Vitals:   12/09/18 1052  BP: 129/90  Pulse: (!) 106  Resp: 16  Temp: 97.8 F (36.6 C)  TempSrc: Other (Comment)  Weight: 124 lb 6.4 oz (56.4 kg)  Body mass index is 22.75 kg/m.   Physical Exam Vitals signs reviewed.  Constitutional:      General: She is not in acute distress.    Appearance: Normal appearance. She is well-developed and normal weight. She is not ill-appearing or diaphoretic.  Cardiovascular:     Rate and Rhythm: Normal rate and regular rhythm.     Heart sounds: Normal heart sounds. No murmur. No friction rub. No gallop.   Pulmonary:     Effort: Pulmonary effort is normal. No respiratory distress.     Breath sounds: Normal breath sounds. No wheezing or rales.  Abdominal:     General: Bowel sounds are normal. There is no distension.     Palpations: Abdomen is soft. There is no mass.     Tenderness: There is no abdominal tenderness. There is no right CVA tenderness, left CVA tenderness, guarding or rebound.  Genitourinary:    General: Normal vulva.     Exam position: Supine.     Pubic Area: No rash.      Labia:        Right: No rash, tenderness, lesion or injury.  Left: No rash, tenderness, lesion or injury.      Vagina: Vaginal discharge (thin, milky discharge with slight odor) present. No erythema, tenderness, bleeding or lesions.     Cervix: No cervical motion tenderness or discharge.     Uterus: Normal.      Adnexa: Right adnexa normal and left adnexa normal.  Lymphadenopathy:     Lower Body: No right inguinal adenopathy. No left inguinal adenopathy.  Skin:    General: Skin is warm and dry.  Neurological:     Mental Status: She is alert and oriented to person, place, and time.      No results found for any visits on 12/09/18.     Assessment & Plan    1. Vaginal discharge Vaginal swab collected for STD screen. Patient declines pregnancy testing and reports she is not pregnant. Patient also  declines HIV and RPR testing. Suspect BV due to findings. Will treat with metronidazole as below. Will f/u pending swab results. Call if worsening.  - Cervicovaginal ancillary only  2. BV (bacterial vaginosis) See above medical treatment plan. - metroNIDAZOLE (FLAGYL) 500 MG tablet; Take 1 tablet (500 mg total) by mouth 2 (two) times daily.  Dispense: 14 tablet; Refill: 0     [name], PA-C  [facility]

## 2018-12-09 ENCOUNTER — Other Ambulatory Visit (HOSPITAL_COMMUNITY)
Admission: RE | Admit: 2018-12-09 | Discharge: 2018-12-09 | Disposition: A | Discharge status: Home / Self Care | Payer: BC Managed Care – PPO | Source: Ambulatory Visit | Attending: Physician Assistant | Admitting: Physician Assistant

## 2018-12-09 ENCOUNTER — Other Ambulatory Visit: Payer: Self-pay

## 2018-12-09 ENCOUNTER — Ambulatory Visit (INDEPENDENT_AMBULATORY_CARE_PROVIDER_SITE_OTHER): Payer: BC Managed Care – PPO | Admitting: Physician Assistant

## 2018-12-09 ENCOUNTER — Encounter: Payer: Self-pay | Admitting: Physician Assistant

## 2018-12-09 VITALS — BP 129/90 | HR 106 | Temp 97.8°F | Resp 16 | Wt 124.4 lb

## 2018-12-09 DIAGNOSIS — N760 Acute vaginitis: Secondary | ICD-10-CM | POA: Diagnosis not present

## 2018-12-09 DIAGNOSIS — N898 Other specified noninflammatory disorders of vagina: Secondary | ICD-10-CM

## 2018-12-09 DIAGNOSIS — B9689 Other specified bacterial agents as the cause of diseases classified elsewhere: Secondary | ICD-10-CM | POA: Insufficient documentation

## 2018-12-09 MED ORDER — METRONIDAZOLE 500 MG PO TABS: 500.0000 mg | ORAL_TABLET | Freq: Two times a day (BID) | ORAL | 0 refills | Status: DC

## 2018-12-09 NOTE — Patient Instructions (Signed)

## 2018-12-17 ENCOUNTER — Telehealth: Payer: Self-pay

## 2018-12-17 LAB — CERVICOVAGINAL ANCILLARY ONLY
Bacterial Vaginitis (gardnerella): POSITIVE — AB
Candida Glabrata: NEGATIVE
Candida Vaginitis: NEGATIVE
Chlamydia: NEGATIVE
Comment: NEGATIVE
Comment: NEGATIVE
Comment: NEGATIVE
Comment: NEGATIVE
Comment: NEGATIVE
Comment: NORMAL
Neisseria Gonorrhea: NEGATIVE
Trichomonas: NEGATIVE

## 2018-12-17 NOTE — Telephone Encounter (Signed)
-----   Message from Mar Daring, [place] sent at 12/17/2018 10:17 AM EDT ----- Swab was only positive for BV which I have treated you for. Was negative for gonorrhea, chlamydia, trichomonas and yeast.

## 2018-12-17 NOTE — Telephone Encounter (Signed)
LMTCB

## 2018-12-18 NOTE — Telephone Encounter (Signed)
Patient advised as directed below. 

## 2018-12-18 NOTE — Telephone Encounter (Signed)
Pt needing a call back on her lab results at [phone].  Thanks, [employer]

## 2018-12-18 NOTE — Telephone Encounter (Signed)
LMTCB 12/18/2018  Thanks,   -Mickel Baas

## 2019-02-19 NOTE — L&D Delivery Note (Signed)
Delivery Note At 1204, a viable female was delivered vaginally over a first degree perineal laceration. The head presented LOA after an internal rotation from LOP.  Easy shoulder delivery, using gentle downward traction.  A Double nuchal cord was noted, and was reduced prior to the delivery of the shoulders. A true knot in the cord was also visible.   Spontaneous respirations noted, and the baby was placed upon the maternal chest for tactile stimulation and bonding. APGAR: 8,9 ; weight pending   .   Placenta status:  intact.  Cord:3 vessel.    Anesthesia:  Lidocaine local Episiotomy:  none Lacerations:  first degree perineal (repaired), left labial laceration not repaired Suture Repair: 3.0 vicryl rapide Est. Blood Loss (mL):    Mom to postpartum.  Baby to Couplet care / Skin to Skin. Both mother and baby in stable condition after delivery.  Mirna Mires 09/29/2019, 1:02 PM

## 2019-02-24 ENCOUNTER — Ambulatory Visit (INDEPENDENT_AMBULATORY_CARE_PROVIDER_SITE_OTHER): Payer: Medicaid Other | Admitting: Obstetrics & Gynecology

## 2019-02-24 ENCOUNTER — Encounter: Payer: Self-pay | Admitting: Obstetrics & Gynecology

## 2019-02-24 ENCOUNTER — Other Ambulatory Visit: Payer: Self-pay

## 2019-02-24 ENCOUNTER — Other Ambulatory Visit (HOSPITAL_COMMUNITY)
Admission: RE | Admit: 2019-02-24 | Discharge: 2019-02-24 | Disposition: A | Discharge status: Home / Self Care | Payer: BC Managed Care – PPO | Source: Ambulatory Visit | Attending: Obstetrics & Gynecology | Admitting: Obstetrics & Gynecology

## 2019-02-24 VITALS — BP 120/80 | Wt 127.0 lb

## 2019-02-24 DIAGNOSIS — Z124 Encounter for screening for malignant neoplasm of cervix: Secondary | ICD-10-CM | POA: Diagnosis present

## 2019-02-24 DIAGNOSIS — N926 Irregular menstruation, unspecified: Secondary | ICD-10-CM

## 2019-02-24 DIAGNOSIS — Z3481 Encounter for supervision of other normal pregnancy, first trimester: Secondary | ICD-10-CM

## 2019-02-24 DIAGNOSIS — Z23 Encounter for immunization: Secondary | ICD-10-CM | POA: Diagnosis not present

## 2019-02-24 DIAGNOSIS — Z113 Encounter for screening for infections with a predominantly sexual mode of transmission: Secondary | ICD-10-CM

## 2019-02-24 DIAGNOSIS — Z3A08 8 weeks gestation of pregnancy: Secondary | ICD-10-CM

## 2019-02-24 DIAGNOSIS — Z3201 Encounter for pregnancy test, result positive: Secondary | ICD-10-CM | POA: Diagnosis not present

## 2019-02-24 DIAGNOSIS — Z3490 Encounter for supervision of normal pregnancy, unspecified, unspecified trimester: Secondary | ICD-10-CM

## 2019-02-24 DIAGNOSIS — Z3482 Encounter for supervision of other normal pregnancy, second trimester: Secondary | ICD-10-CM | POA: Insufficient documentation

## 2019-02-24 LAB — POCT URINE PREGNANCY: Preg Test, Ur: POSITIVE — AB

## 2019-02-24 LAB — POCT URINALYSIS DIPSTICK OB
Glucose, UA: NEGATIVE
POC,PROTEIN,UA: NEGATIVE

## 2019-02-24 NOTE — Patient Instructions (Signed)
First Trimester of Pregnancy The first trimester of pregnancy is from week 1 until the end of week 13 (months 1 through 3). A week after a sperm fertilizes an egg, the egg will implant on the wall of the uterus. This embryo will begin to develop into a baby. Genes from you and your partner will form the baby. The female genes will determine whether the baby will be a boy or a girl. At 6-8 weeks, the eyes and face will be formed, and the heartbeat can be seen on ultrasound. At the end of 12 weeks, all the baby's organs will be formed. Now that you are pregnant, you will want to do everything you can to have a healthy baby. Two of the most important things are to get good prenatal care and to follow your health care provider's instructions. Prenatal care is all the medical care you receive before the baby's birth. This care will help prevent, find, and treat any problems during the pregnancy and childbirth. Body changes during your first trimester Your body goes through many changes during pregnancy. The changes vary from woman to woman.  You may gain or lose a couple of pounds at first.  You may feel sick to your stomach (nauseous) and you may throw up (vomit). If the vomiting is uncontrollable, call your health care provider.  You may tire easily.  You may develop headaches that can be relieved by medicines. All medicines should be approved by your health care provider.  You may urinate more often. Painful urination may mean you have a bladder infection.  You may develop heartburn as a result of your pregnancy.  You may develop constipation because certain hormones are causing the muscles that push stool through your intestines to slow down.  You may develop hemorrhoids or swollen veins (varicose veins).  Your breasts may begin to grow larger and become tender. Your nipples may stick out more, and the tissue that surrounds them (areola) may become darker.  Your gums may bleed and may be  sensitive to brushing and flossing.  Dark spots or blotches (chloasma, mask of pregnancy) may develop on your face. This will likely fade after the baby is born.  Your menstrual periods will stop.  You may have a loss of appetite.  You may develop cravings for certain kinds of food.  You may have changes in your emotions from day to day, such as being excited to be pregnant or being concerned that something may go wrong with the pregnancy and baby.  You may have more vivid and strange dreams.  You may have changes in your hair. These can include thickening of your hair, rapid growth, and changes in texture. Some women also have hair loss during or after pregnancy, or hair that feels dry or thin. Your hair will most likely return to normal after your baby is born. What to expect at prenatal visits During a routine prenatal visit:  You will be weighed to make sure you and the baby are growing normally.  Your blood pressure will be taken.  Your abdomen will be measured to track your baby's growth.  The fetal heartbeat will be listened to between weeks 10 and 14 of your pregnancy.  Test results from any previous visits will be discussed. Your health care provider may ask you:  How you are feeling.  If you are feeling the baby move.  If you have had any abnormal symptoms, such as leaking fluid, bleeding, severe headaches, or abdominal   cramping.  If you are using any tobacco products, including cigarettes, chewing tobacco, and electronic cigarettes.  If you have any questions. Other tests that may be performed during your first trimester include:  Blood tests to find your blood type and to check for the presence of any previous infections. The tests will also be used to check for low iron levels (anemia) and protein on red blood cells (Rh antibodies). Depending on your risk factors, or if you previously had diabetes during pregnancy, you may have tests to check for high blood sugar  that affects pregnant women (gestational diabetes).  Urine tests to check for infections, diabetes, or protein in the urine.  An ultrasound to confirm the proper growth and development of the baby.  Fetal screens for spinal cord problems (spina bifida) and Down syndrome.  HIV (human immunodeficiency virus) testing. Routine prenatal testing includes screening for HIV, unless you choose not to have this test.  You may need other tests to make sure you and the baby are doing well. Follow these instructions at home: Medicines  Follow your health care provider's instructions regarding medicine use. Specific medicines may be either safe or unsafe to take during pregnancy.  Take a prenatal vitamin that contains at least 600 micrograms (mcg) of folic acid.  If you develop constipation, try taking a stool softener if your health care provider approves. Eating and drinking   Eat a balanced diet that includes fresh fruits and vegetables, whole grains, good sources of protein such as meat, eggs, or tofu, and low-fat dairy. Your health care provider will help you determine the amount of weight gain that is right for you.  Avoid raw meat and uncooked cheese. These carry germs that can cause birth defects in the baby.  Eating four or five small meals rather than three large meals a day may help relieve nausea and vomiting. If you start to feel nauseous, eating a few soda crackers can be helpful. Drinking liquids between meals, instead of during meals, also seems to help ease nausea and vomiting.  Limit foods that are high in fat and processed sugars, such as fried and sweet foods.  To prevent constipation: ? Eat foods that are high in fiber, such as fresh fruits and vegetables, whole grains, and beans. ? Drink enough fluid to keep your urine clear or pale yellow. Activity  Exercise only as directed by your health care provider. Most women can continue their usual exercise routine during  pregnancy. Try to exercise for 30 minutes at least 5 days a week. Exercising will help you: ? Control your weight. ? Stay in shape. ? Be prepared for labor and delivery.  Experiencing pain or cramping in the lower abdomen or lower back is a good sign that you should stop exercising. Check with your health care provider before continuing with normal exercises.  Try to avoid standing for long periods of time. Move your legs often if you must stand in one place for a long time.  Avoid heavy lifting.  Wear low-heeled shoes and practice good posture.  You may continue to have sex unless your health care provider tells you not to. Relieving pain and discomfort  Wear a good support bra to relieve breast tenderness.  Take warm sitz baths to soothe any pain or discomfort caused by hemorrhoids. Use hemorrhoid cream if your health care provider approves.  Rest with your legs elevated if you have leg cramps or low back pain.  If you develop varicose veins in   your legs, wear support hose. Elevate your feet for 15 minutes, 3-4 times a day. Limit salt in your diet. Prenatal care  Schedule your prenatal visits by the twelfth week of pregnancy. They are usually scheduled monthly at first, then more often in the last 2 months before delivery.  Write down your questions. Take them to your prenatal visits.  Keep all your prenatal visits as told by your health care provider. This is important. Safety  Wear your seat belt at all times when driving.  Make a list of emergency phone numbers, including numbers for family, friends, the hospital, and police and fire departments. General instructions  Ask your health care provider for a referral to a local prenatal education class. Begin classes no later than the beginning of month 6 of your pregnancy.  Ask for help if you have counseling or nutritional needs during pregnancy. Your health care provider can offer advice or refer you to specialists for help  with various needs.  Do not use hot tubs, steam rooms, or saunas.  Do not douche or use tampons or scented sanitary pads.  Do not cross your legs for long periods of time.  Avoid cat litter boxes and soil used by cats. These carry germs that can cause birth defects in the baby and possibly loss of the fetus by miscarriage or stillbirth.  Avoid all smoking, herbs, alcohol, and medicines not prescribed by your health care provider. Chemicals in these products affect the formation and growth of the baby.  Do not use any products that contain nicotine or tobacco, such as cigarettes and e-cigarettes. If you need help quitting, ask your health care provider. You may receive counseling support and other resources to help you quit.  Schedule a dentist appointment. At home, brush your teeth with a soft toothbrush and be gentle when you floss. Contact a health care provider if:  You have dizziness.  You have mild pelvic cramps, pelvic pressure, or nagging pain in the abdominal area.  You have persistent nausea, vomiting, or diarrhea.  You have a bad smelling vaginal discharge.  You have pain when you urinate.  You notice increased swelling in your face, hands, legs, or ankles.  You are exposed to fifth disease or chickenpox.  You are exposed to German measles (rubella) and have never had it. Get help right away if:  You have a fever.  You are leaking fluid from your vagina.  You have spotting or bleeding from your vagina.  You have severe abdominal cramping or pain.  You have rapid weight gain or loss.  You vomit blood or material that looks like coffee grounds.  You develop a severe headache.  You have shortness of breath.  You have any kind of trauma, such as from a fall or a car accident. Summary  The first trimester of pregnancy is from week 1 until the end of week 13 (months 1 through 3).  Your body goes through many changes during pregnancy. The changes vary from  woman to woman.  You will have routine prenatal visits. During those visits, your health care provider will examine you, discuss any test results you may have, and talk with you about how you are feeling. This information is not intended to replace advice given to you by your health care provider. Make sure you discuss any questions you have with your health care provider. Document Revised: 01/17/2017 Document Reviewed: 01/17/2016 Elsevier Patient Education  2020 [employer]  

## 2019-02-24 NOTE — Progress Notes (Signed)
02/24/2019   Chief Complaint: Missed period  Transfer of Care Patient: no  History of Present Illness: Melissa Barr is a 24 y.o. G2P1001 [redacted]w[redacted]d based on Patient's last menstrual period was 12/30/2018. with an Estimated Date of Delivery: 10/06/19, with the above CC.   Her periods were: regular periods every 28 days She was using no method when she conceived. She had stopped POP for several months. She has Positive signs or symptoms of nausea/vomiting of pregnancy. She has Negative signs or symptoms of miscarriage or preterm labor She identifies Negative Zika risk factors for her and her partner On any different medications around the time she conceived/early pregnancy: No  History of varicella: Yes   ROS: A 12-point review of systems was performed and negative, except as stated in the above HPI.  OBGYN History: As per HPI. OB History  Gravida Para Term Preterm AB Living  2 1 1  0 0 1  SAB TAB Ectopic Multiple Live Births  0 0 0 0 0    # Outcome Date GA Lbr Len/2nd Weight Sex Delivery Anes PTL Lv  2 Current           1 Term 07/24/18 [redacted]w[redacted]d    Vag-Spont       Any issues with any prior pregnancies: no Any prior children are healthy, doing well, without any problems or issues: yes History of pap smears: Yes. Last pap smear 12/2017. Abnormal: no  History of STIs: No   Past Medical History: Past Medical History:  Diagnosis Date  . Allergy   . Dysmenorrhea   . Esophageal reflux   . Human papilloma virus (HPV) type 9 vaccine administered    gardisil series complete    Past Surgical History: Past Surgical History:  Procedure Laterality Date  . NO PAST SURGERIES      Family History:  Family History  Problem Relation Age of Onset  . Thyroid cancer Mother   . Hypertension Mother   . Hyperlipidemia Father   . Anemia Sister   . Heart disease Maternal Grandmother   . Hypertension Maternal Grandmother   . Bladder Cancer Maternal Grandfather 54  . Heart disease Maternal Grandfather    . Hypertension Maternal Grandfather   . Heart disease Paternal Grandfather   . Breast cancer Maternal Aunt 35  . Colon cancer Maternal Uncle 50  . Uterine cancer Other 60   She denies any female cancers, bleeding or blood clotting disorders.  She denies any history of mental retardation, birth defects or genetic disorders in her or the FOB's history  Social History:  Social History   Socioeconomic History  . Marital status: Single    Spouse name: Not on file  . Number of children: 0  . Years of education: Not on file  . Highest education level: Not on file  Occupational History  . Occupation: 76  Tobacco Use  . Smoking status: Former Smoker    Packs/day: 0.10    Years: 1.50    Pack years: 0.15    Types: Cigarettes    Quit date: 04/18/2016    Years since quitting: 2.8  . Smokeless tobacco: Never Used  . Tobacco comment: had stopped smoking 04/18/2016 after 1 yr and restarted 12/2016  Substance and Sexual Activity  . Alcohol use: Not Currently    Comment: Ocassional   . Drug use: Not Currently  . Sexual activity: Yes    Partners: Male    Birth control/protection: None  Other Topics Concern  . Not on  file  Social History Narrative  . Not on file   Social Determinants of Health   Financial Resource Strain: Low Risk   . Difficulty of Paying Living Expenses: Not hard at all  Food Insecurity: No Food Insecurity  . Worried About Programme researcher, broadcasting/film/video in the Last Year: Never true  . Ran Out of Food in the Last Year: Never true  Transportation Needs: No Transportation Needs  . Lack of Transportation (Medical): No  . Lack of Transportation (Non-Medical): No  Physical Activity:   . Days of Exercise per Week: Not on file  . Minutes of Exercise per Session: Not on file  Stress:   . Feeling of Stress : Not on file  Social Connections:   . Frequency of Communication with Friends and Family: Not on file  . Frequency of Social Gatherings with Friends and  Family: Not on file  . Attends Religious Services: Not on file  . Active Member of Clubs or Organizations: Not on file  . Attends Banker Meetings: Not on file  . Marital Status: Not on file  Intimate Partner Violence:   . Fear of Current or Ex-Partner: Not on file  . Emotionally Abused: Not on file  . Physically Abused: Not on file  . Sexually Abused: Not on file   Any pets in the household: no  Allergy: Allergies  Allergen Reactions  . Sulfa Antibiotics Shortness Of Breath    rash    Current Outpatient Medications:  Current Outpatient Medications:  .  metroNIDAZOLE (FLAGYL) 500 MG tablet, Take 1 tablet (500 mg total) by mouth 2 (two) times daily. (Patient not taking: Reported on 02/24/2019), Disp: 14 tablet, Rfl: 0   Physical Exam:   BP 120/80   Wt 127 lb (57.6 kg)   LMP 12/30/2018 Comment: normal  BMI 23.23 kg/m  Body mass index is 23.23 kg/m. Constitutional: Well nourished, well developed female in no acute distress.  Neck:  Supple, normal appearance, and no thyromegaly  Cardiovascular: S1, S2 normal, no murmur, rub or gallop, regular rate and rhythm Respiratory:  Clear to auscultation bilateral. Normal respiratory effort Abdomen: positive bowel sounds and no masses, hernias; diffusely non tender to palpation, non distended Breasts: breasts appear normal, no suspicious masses, no skin or nipple changes or axillary nodes. Neuro/Psych:  Normal mood and affect.  Skin:  Warm and dry.  Lymphatic:  No inguinal lymphadenopathy.   Pelvic exam: is not limited by body habitus EGBUS: within normal limits, Vagina: within normal limits and with no blood in the vault, Cervix: normal appearing cervix without discharge or lesions, closed/long/high, Uterus:  enlarged: 8 weeks, and Adnexa:  no mass, fullness, tenderness  Assessment: Melissa Barr is a 24 y.o. G2P1001 [redacted]w[redacted]d based on Patient's last menstrual period was 12/30/2018. with an Estimated Date of Delivery: 10/06/19,   for prenatal care.  Plan:  1) Avoid alcoholic beverages. 2) Patient encouraged not to smoke.  3) Discontinue the use of all non-medicinal drugs and chemicals.  4) Take prenatal vitamins daily.  5) Seatbelt use advised 6) Nutrition, food safety (fish, cheese advisories, and high nitrite foods) and exercise discussed. 7) Hospital and practice style delivering at [facility] discussed  8) Patient is asked about travel to areas at risk for the Zika virus, and counseled to avoid travel and exposure to mosquitoes or sexual partners who may have themselves been exposed to the virus. Testing is discussed, and will be ordered as appropriate.  9) Childbirth classes at [facility] advised 10)  Genetic Screening, such as with 1st Trimester Screening, cell free fetal DNA, AFP testing, and Ultrasound, as well as with amniocentesis and CVS as appropriate, is discussed with patient. She plans to have genetic testing this pregnancy. 11) [place] soon 12) Counseled as to close pregnancy spacing, risks and concerns  Problem list reviewed and updated.  Barnett Applebaum, MD, [facility], [facility] 02/24/2019  11:29 AM

## 2019-02-25 LAB — RPR+RH+ABO+RUB AB+AB SCR+CB...
Antibody Screen: NEGATIVE
HIV Screen 4th Generation wRfx: NONREACTIVE
Hematocrit: 39.7 % (ref 34.0–46.6)
Hemoglobin: 13.4 g/dL (ref 11.1–15.9)
Hepatitis B Surface Ag: NEGATIVE
MCH: 29.7 pg (ref 26.6–33.0)
MCHC: 33.8 g/dL (ref 31.5–35.7)
MCV: 88 fL (ref 79–97)
Platelets: 236 10*3/uL (ref 150–450)
RBC: 4.51 x10E6/uL (ref 3.77–5.28)
RDW: 12.3 % (ref 11.7–15.4)
RPR Ser Ql: NONREACTIVE
Rh Factor: NEGATIVE
Rubella Antibodies, IGG: 2.65 index (ref >0.99)
Varicella zoster IgG: 330 index (ref >165)
WBC: 13.7 10*3/uL — ABNORMAL HIGH (ref 3.4–10.8)

## 2019-02-26 LAB — CYTOLOGY - PAP
Chlamydia: NEGATIVE
Comment: NEGATIVE
Comment: NEGATIVE
Comment: NORMAL
Diagnosis: NEGATIVE
Neisseria Gonorrhea: NEGATIVE
Trichomonas: NEGATIVE

## 2019-02-26 LAB — URINE CULTURE: Organism ID, Bacteria: NO GROWTH

## 2019-03-10 ENCOUNTER — Ambulatory Visit (INDEPENDENT_AMBULATORY_CARE_PROVIDER_SITE_OTHER): Payer: Medicaid Other

## 2019-03-10 ENCOUNTER — Encounter: Payer: Self-pay | Admitting: Advanced Practice Midwife

## 2019-03-10 ENCOUNTER — Ambulatory Visit (INDEPENDENT_AMBULATORY_CARE_PROVIDER_SITE_OTHER): Payer: Medicaid Other | Admitting: Advanced Practice Midwife

## 2019-03-10 ENCOUNTER — Other Ambulatory Visit: Payer: Self-pay

## 2019-03-10 VITALS — BP 116/74 | Wt 131.0 lb

## 2019-03-10 DIAGNOSIS — N926 Irregular menstruation, unspecified: Secondary | ICD-10-CM

## 2019-03-10 DIAGNOSIS — Z3A10 10 weeks gestation of pregnancy: Secondary | ICD-10-CM

## 2019-03-10 DIAGNOSIS — O3680X0 Pregnancy with inconclusive fetal viability, not applicable or unspecified: Secondary | ICD-10-CM

## 2019-03-10 DIAGNOSIS — Z3490 Encounter for supervision of normal pregnancy, unspecified, unspecified trimester: Secondary | ICD-10-CM

## 2019-03-10 DIAGNOSIS — Z3481 Encounter for supervision of other normal pregnancy, first trimester: Secondary | ICD-10-CM

## 2019-03-10 NOTE — Progress Notes (Signed)
No vb. No lof. U/s today.  
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
## 2019-03-11 LAB — URINE DRUG PANEL 7
Amphetamines, Urine: NEGATIVE ng/mL
Barbiturate Quant, Ur: NEGATIVE ng/mL
Benzodiazepine Quant, Ur: NEGATIVE ng/mL
Cannabinoid Quant, Ur: NEGATIVE ng/mL
Cocaine (Metab.): NEGATIVE ng/mL
Opiate Quant, Ur: NEGATIVE ng/mL
PCP Quant, Ur: NEGATIVE ng/mL

## 2019-03-15 LAB — MATERNIT 21 PLUS CORE, BLOOD
Fetal Fraction: 11
Result (T21): NEGATIVE
Trisomy 13 (Patau syndrome): NEGATIVE
Trisomy 18 (Edwards syndrome): NEGATIVE
Trisomy 21 (Down syndrome): NEGATIVE

## 2019-04-07 ENCOUNTER — Ambulatory Visit (INDEPENDENT_AMBULATORY_CARE_PROVIDER_SITE_OTHER): Payer: Medicaid Other | Admitting: Obstetrics and Gynecology

## 2019-04-07 ENCOUNTER — Encounter: Payer: Self-pay | Admitting: Obstetrics and Gynecology

## 2019-04-07 ENCOUNTER — Other Ambulatory Visit: Payer: Self-pay

## 2019-04-07 VITALS — BP 100/52 | Wt 135.0 lb

## 2019-04-07 DIAGNOSIS — Z3A14 14 weeks gestation of pregnancy: Secondary | ICD-10-CM

## 2019-04-07 DIAGNOSIS — Z3490 Encounter for supervision of normal pregnancy, unspecified, unspecified trimester: Secondary | ICD-10-CM

## 2019-04-07 DIAGNOSIS — Z3482 Encounter for supervision of other normal pregnancy, second trimester: Secondary | ICD-10-CM

## 2019-04-07 NOTE — Progress Notes (Signed)
ROB C/o nausea, and having some dizziness Feeling some flutters
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
## 2019-04-07 NOTE — Patient Instructions (Addendum)
SlowFE- two times a day- [facility]   Second Trimester of Pregnancy  The second trimester is from week 14 through week 27 (month 4 through 6). This is often the time in pregnancy that you feel your best. Often times, morning sickness has lessened or quit. You may have more energy, and you may get hungry more often. Your unborn baby is growing rapidly. At the end of the sixth month, he or she is about 9 inches long and weighs about 1 pounds. You will likely feel the baby move between 18 and 20 weeks of pregnancy. Follow these instructions at home: Medicines  Take over-the-counter and prescription medicines only as told by your doctor. Some medicines are safe and some medicines are not safe during pregnancy.  Take a prenatal vitamin that contains at least 600 micrograms (mcg) of folic acid.  If you have trouble pooping (constipation), take medicine that will make your stool soft (stool softener) if your doctor approves. Eating and drinking   Eat regular, healthy meals.  Avoid raw meat and uncooked cheese.  If you get low calcium from the food you eat, talk to your doctor about taking a daily calcium supplement.  Avoid foods that are high in fat and sugars, such as fried and sweet foods.  If you feel sick to your stomach (nauseous) or throw up (vomit): ? Eat 4 or 5 small meals a day instead of 3 large meals. ? Try eating a few soda crackers. ? Drink liquids between meals instead of during meals.  To prevent constipation: ? Eat foods that are high in fiber, like fresh fruits and vegetables, whole grains, and beans. ? Drink enough fluids to keep your pee (urine) clear or pale yellow. Activity  Exercise only as told by your doctor. Stop exercising if you start to have cramps.  Do not exercise if it is too hot, too humid, or if you are in a place of great height (high altitude).  Avoid heavy lifting.  Wear low-heeled shoes. Sit and stand up straight.  You can continue to  have sex unless your doctor tells you not to. Relieving pain and discomfort  Wear a good support bra if your breasts are tender.  Take warm water baths (sitz baths) to soothe pain or discomfort caused by hemorrhoids. Use hemorrhoid cream if your doctor approves.  Rest with your legs raised if you have leg cramps or low back pain.  If you develop puffy, bulging veins (varicose veins) in your legs: ? Wear support hose or compression stockings as told by your doctor. ? Raise (elevate) your feet for 15 minutes, 3-4 times a day. ? Limit salt in your food. Prenatal care  Write down your questions. Take them to your prenatal visits.  Keep all your prenatal visits as told by your doctor. This is important. Safety  Wear your seat belt when driving.  Make a list of emergency phone numbers, including numbers for family, friends, the hospital, and police and fire departments. General instructions  Ask your doctor about the right foods to eat or for help finding a counselor, if you need these services.  Ask your doctor about local prenatal classes. Begin classes before month 6 of your pregnancy.  Do not use hot tubs, steam rooms, or saunas.  Do not douche or use tampons or scented sanitary pads.  Do not cross your legs for long periods of time.  Visit your dentist if you have not done so. Use a soft toothbrush to brush  your teeth. Floss gently.  Avoid all smoking, herbs, and alcohol. Avoid drugs that are not approved by your doctor.  Do not use any products that contain nicotine or tobacco, such as cigarettes and e-cigarettes. If you need help quitting, ask your doctor.  Avoid cat litter boxes and soil used by cats. These carry germs that can cause birth defects in the baby and can cause a loss of your baby (miscarriage) or stillbirth. Contact a doctor if:  You have mild cramps or pressure in your lower belly.  You have pain when you pee (urinate).  You have bad smelling fluid  coming from your vagina.  You continue to feel sick to your stomach (nauseous), throw up (vomit), or have watery poop (diarrhea).  You have a nagging pain in your belly area.  You feel dizzy. Get help right away if:  You have a fever.  You are leaking fluid from your vagina.  You have spotting or bleeding from your vagina.  You have severe belly cramping or pain.  You lose or gain weight rapidly.  You have trouble catching your breath and have chest pain.  You notice sudden or extreme puffiness (swelling) of your face, hands, ankles, feet, or legs.  You have not felt the baby move in over an hour.  You have severe headaches that do not go away when you take medicine.  You have trouble seeing. Summary  The second trimester is from week 14 through week 27 (months 4 through 6). This is often the time in pregnancy that you feel your best.  To take care of yourself and your unborn baby, you will need to eat healthy meals, take medicines only if your doctor tells you to do so, and do activities that are safe for you and your baby.  Call your doctor if you get sick or if you notice anything unusual about your pregnancy. Also, call your doctor if you need help with the right food to eat, or if you want to know what activities are safe for you. This information is not intended to replace advice given to you by your health care provider. Make sure you discuss any questions you have with your health care provider. Document Revised: 05/29/2018 Document Reviewed: 03/12/2016 Elsevier Patient Education  2020 [employer]    Iron-Rich Diet  Iron is a mineral that helps your body to produce hemoglobin. Hemoglobin is a protein in red blood cells that carries oxygen to your body's tissues. Eating too little iron may cause you to feel weak and tired, and it can increase your risk of infection. Iron is naturally found in many foods, and many foods have iron added to them (iron-fortified  foods). You may need to follow an iron-rich diet if you do not have enough iron in your body due to certain medical conditions. The amount of iron that you need each day depends on your age, your sex, and any medical conditions you have. Follow instructions from your health care provider or a diet and nutrition specialist (dietitian) about how much iron you should eat each day. What are tips for following this plan? Reading food labels  Check food labels to see how many milligrams (mg) of iron are in each serving. Cooking  Cook foods in pots and pans that are made from iron.  Take these steps to make it easier for your body to absorb iron from certain foods: ? Soak beans overnight before cooking. ? Soak whole grains overnight and drain  them before using. ? Ferment flours before baking, such as by using yeast in bread dough. Meal planning  When you eat foods that contain iron, you should eat them with foods that are high in vitamin C. These include oranges, peppers, tomatoes, potatoes, and mango. Vitamin C helps your body to absorb iron. General information  Take iron supplements only as told by your health care provider. An overdose of iron can be life-threatening. If you were prescribed iron supplements, take them with orange juice or a vitamin C supplement.  When you eat iron-fortified foods or take an iron supplement, you should also eat foods that naturally contain iron, such as meat, poultry, and fish. Eating naturally iron-rich foods helps your body to absorb the iron that is added to other foods or contained in a supplement.  Certain foods and drinks prevent your body from absorbing iron properly. Avoid eating these foods in the same meal as iron-rich foods or with iron supplements. These foods include: ? Coffee, black tea, and red wine. ? Milk, dairy products, and foods that are high in calcium. ? Beans and soybeans. ? Whole grains. What foods should I eat? Fruits Prunes.  Raisins. Eat fruits high in vitamin C, such as oranges, grapefruits, and strawberries, alongside iron-rich foods. Vegetables Spinach (cooked). Green peas. Broccoli. Fermented vegetables. Eat vegetables high in vitamin C, such as leafy greens, potatoes, bell peppers, and tomatoes, alongside iron-rich foods. Grains Iron-fortified breakfast cereal. Iron-fortified whole-wheat bread. Enriched rice. Sprouted grains. Meats and other proteins Beef liver. Oysters. Beef. Shrimp. [place]. Chicken. [place]. Sardines. Chickpeas. Nuts. Tofu. Pumpkin seeds. Beverages Tomato juice. Fresh orange juice. Prune juice. Hibiscus tea. Fortified instant breakfast shakes. Sweets and desserts Blackstrap molasses. Seasonings and condiments Tahini. Fermented soy sauce. Other foods Wheat germ. The items listed above may not be a complete list of recommended foods and beverages. Contact a dietitian for more information. What foods should I avoid? Grains Whole grains. Bran cereal. Bran flour. Oats. Meats and other proteins Soybeans. Products made from soy protein. Black beans. Lentils. Mung beans. Split peas. Dairy Milk. Cream. Cheese. Yogurt. Cottage cheese. Beverages Coffee. Black tea. Red wine. Sweets and desserts Cocoa. Chocolate. Ice cream. Other foods Basil. Oregano. Large amounts of parsley. The items listed above may not be a complete list of foods and beverages to avoid. Contact a dietitian for more information. Summary  Iron is a mineral that helps your body to produce hemoglobin. Hemoglobin is a protein in red blood cells that carries oxygen to your body's tissues.  Iron is naturally found in many foods, and many foods have iron added to them (iron-fortified foods).  When you eat foods that contain iron, you should eat them with foods that are high in vitamin C. Vitamin C helps your body to absorb iron.  Certain foods and drinks prevent your body from absorbing iron properly, such as whole grains and  dairy products. You should avoid eating these foods in the same meal as iron-rich foods or with iron supplements. This information is not intended to replace advice given to you by your health care provider. Make sure you discuss any questions you have with your health care provider. Document Revised: 01/17/2017 Document Reviewed: 12/31/2016 Elsevier Patient Education  2020 [employer].

## 2019-04-08 LAB — RPR+RH+ABO+RUB AB+AB SCR+CB...
Antibody Screen: NEGATIVE
HIV Screen 4th Generation wRfx: NONREACTIVE
Hematocrit: 35.9 % (ref 34.0–46.6)
Hemoglobin: 12.1 g/dL (ref 11.1–15.9)
Hepatitis B Surface Ag: NEGATIVE
MCH: 30 pg (ref 26.6–33.0)
MCHC: 33.7 g/dL (ref 31.5–35.7)
MCV: 89 fL (ref 79–97)
Platelets: 218 10*3/uL (ref 150–450)
RBC: 4.03 x10E6/uL (ref 3.77–5.28)
RDW: 13 % (ref 11.7–15.4)
RPR Ser Ql: NONREACTIVE
Rh Factor: NEGATIVE
Rubella Antibodies, IGG: 2.04 index (ref >0.99)
Varicella zoster IgG: 208 index (ref >165)
WBC: 10.7 10*3/uL (ref 3.4–10.8)

## 2019-05-12 ENCOUNTER — Ambulatory Visit (INDEPENDENT_AMBULATORY_CARE_PROVIDER_SITE_OTHER): Payer: Medicaid Other

## 2019-05-12 ENCOUNTER — Ambulatory Visit (INDEPENDENT_AMBULATORY_CARE_PROVIDER_SITE_OTHER): Payer: Medicaid Other | Admitting: Obstetrics and Gynecology

## 2019-05-12 ENCOUNTER — Other Ambulatory Visit: Payer: Self-pay

## 2019-05-12 VITALS — BP 114/70 | Wt 141.0 lb

## 2019-05-12 DIAGNOSIS — Z3482 Encounter for supervision of other normal pregnancy, second trimester: Secondary | ICD-10-CM

## 2019-05-12 DIAGNOSIS — Z3A19 19 weeks gestation of pregnancy: Secondary | ICD-10-CM | POA: Diagnosis not present

## 2019-05-12 DIAGNOSIS — Z363 Encounter for antenatal screening for malformations: Secondary | ICD-10-CM | POA: Diagnosis not present

## 2019-05-12 DIAGNOSIS — Z3490 Encounter for supervision of normal pregnancy, unspecified, unspecified trimester: Secondary | ICD-10-CM
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
## 2019-05-12 NOTE — Progress Notes (Signed)
No vb. No lof. U/s today.  

## 2019-06-09 ENCOUNTER — Other Ambulatory Visit: Payer: Self-pay

## 2019-06-09 ENCOUNTER — Encounter: Payer: Self-pay | Admitting: Advanced Practice Midwife

## 2019-06-09 ENCOUNTER — Ambulatory Visit (INDEPENDENT_AMBULATORY_CARE_PROVIDER_SITE_OTHER): Payer: Medicaid Other | Admitting: Advanced Practice Midwife

## 2019-06-09 VITALS — BP 100/60 | Wt 148.0 lb

## 2019-06-09 DIAGNOSIS — Z131 Encounter for screening for diabetes mellitus: Secondary | ICD-10-CM

## 2019-06-09 DIAGNOSIS — Z113 Encounter for screening for infections with a predominantly sexual mode of transmission: Secondary | ICD-10-CM

## 2019-06-09 DIAGNOSIS — Z130 Encounter for screening for diseases of the blood and blood-forming organs and certain disorders involving the immune mechanism: Secondary | ICD-10-CM

## 2019-06-09 DIAGNOSIS — Z3A23 23 weeks gestation of pregnancy: Secondary | ICD-10-CM

## 2019-06-09 DIAGNOSIS — Z3482 Encounter for supervision of other normal pregnancy, second trimester: Secondary | ICD-10-CM

## 2019-06-09 LAB — POCT URINALYSIS DIPSTICK OB
Glucose, UA: NEGATIVE
POC,PROTEIN,UA: NEGATIVE

## 2019-06-09 NOTE — Addendum Note (Signed)
Addended by: Cornelius Moras D on: 06/09/2019 11:29 AM   Modules accepted: Orders
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
## 2019-06-28 IMAGING — US US MFM OB COMPLETE +14 WKS
1 series · 13 of 28 positions shown · non-contrast
Comparison: none

PATIENT INFO:

PERFORMED BY:
                   Sonographer
                   [facility] CNM
SERVICE(S) PROVIDED:
  US MFM OB COMP LESS THAN 14 WEEKS                    76801.4
 ----------------------------------------------------------------------
INDICATIONS:
  11 weeks gestation of pregnancy
FETAL EVALUATION:
 Num Of Fetuses:         1
 Fetal Heart Rate(bpm):  158
 Cardiac Activity:       Present
 Presentation:           Variable
 Placenta:               Posterior
GESTATIONAL AGE:
 LMP:           11w 6d        Date:  10/28/17                 EDD:   08/04/18
 Best:          11w 6d     Det. By:  LMP  (10/28/17)          EDD:   08/04/18
1ST TRIMESTER GENETIC SONOGRAM SCREENING:
 CRL:            51.9  mm    G. Age:   11w 6d                 EDD:   08/04/18

[Series 1: us mfm ob complete +14 wks · 0.19mm/px · 13 of 43 slices shown]
[im 2/43]
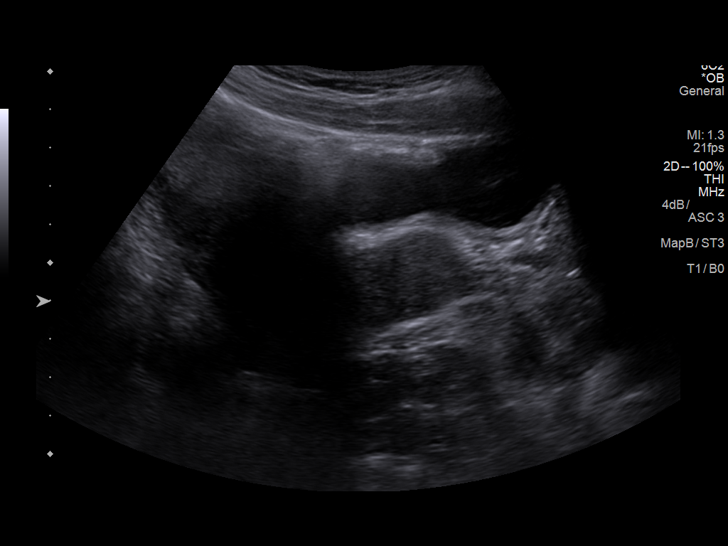
[im 5/43]
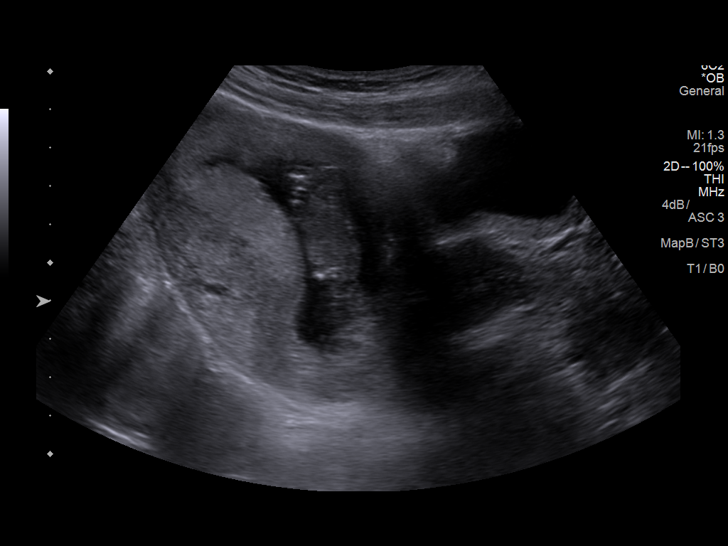
[im 8/43]
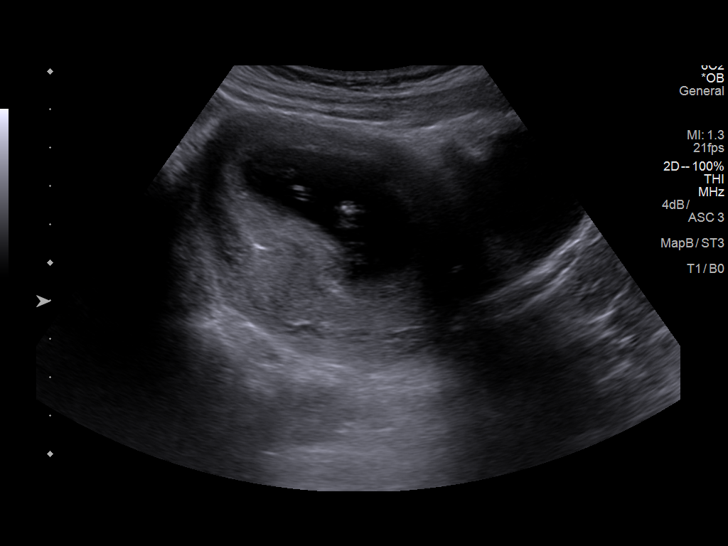
[im 11/43]
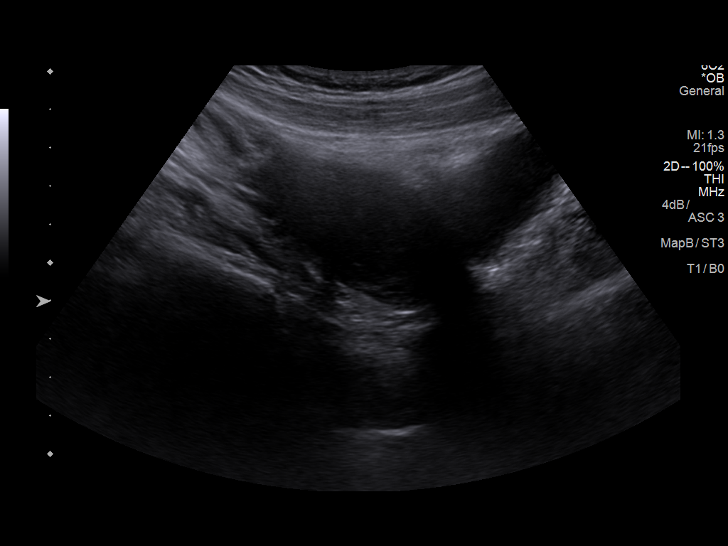
[im 15/43]
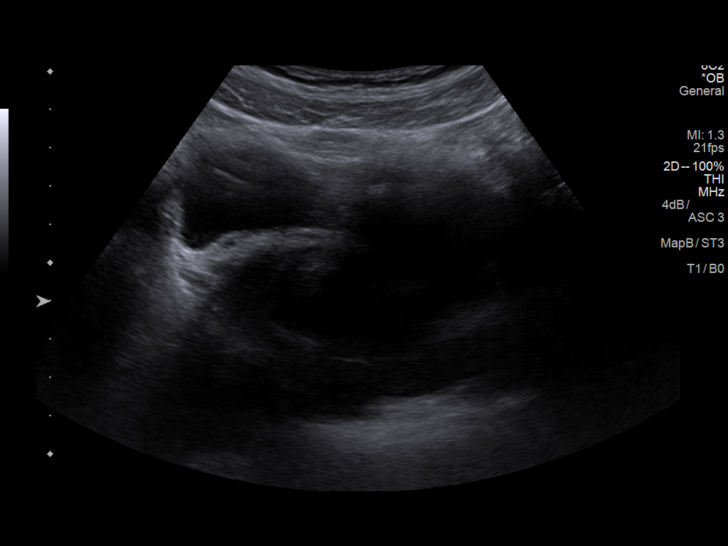
[im 18/43]
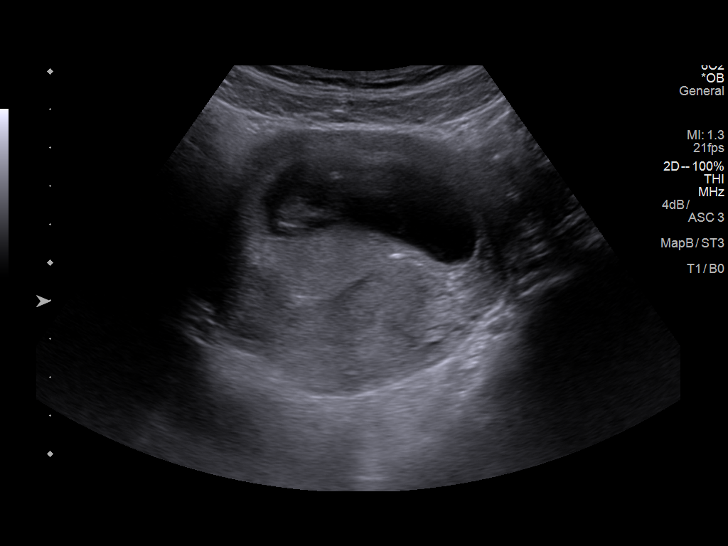
[im 22/43]
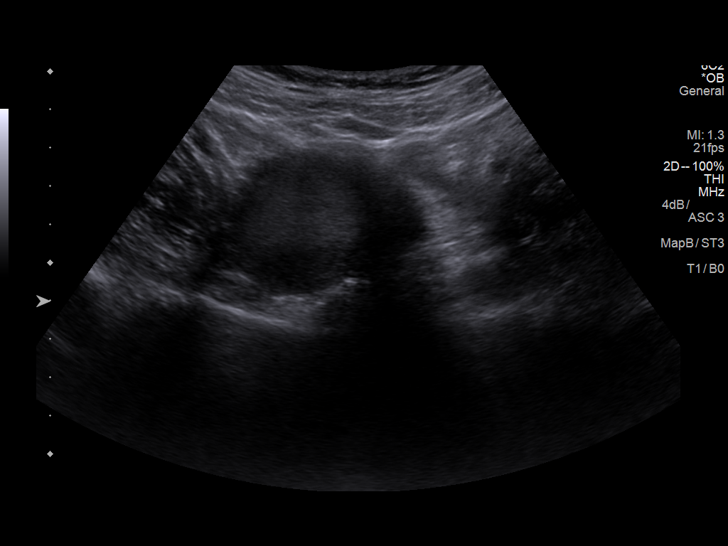
[im 25/43]
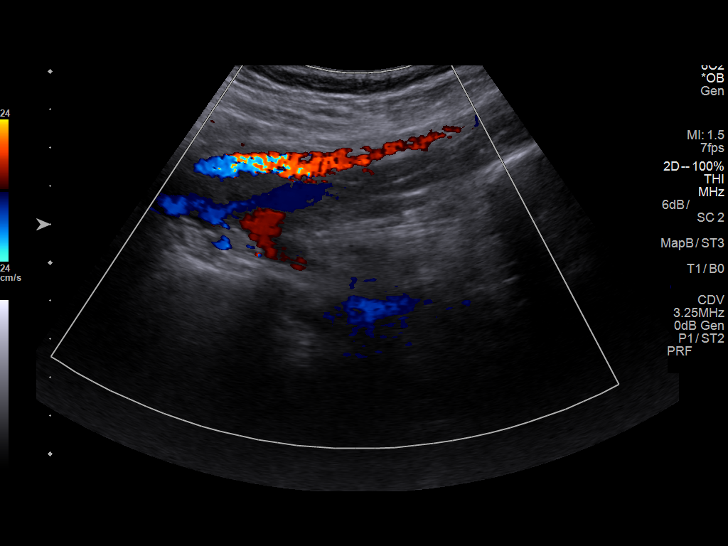
[im 29/43]
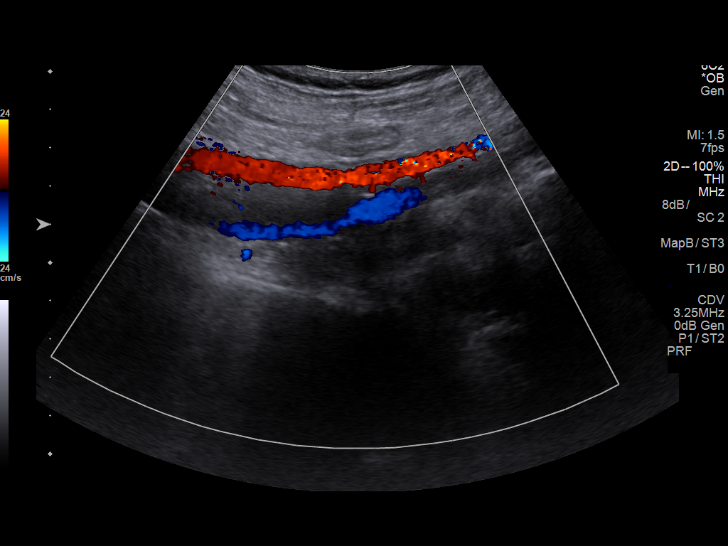
[im 32/43]
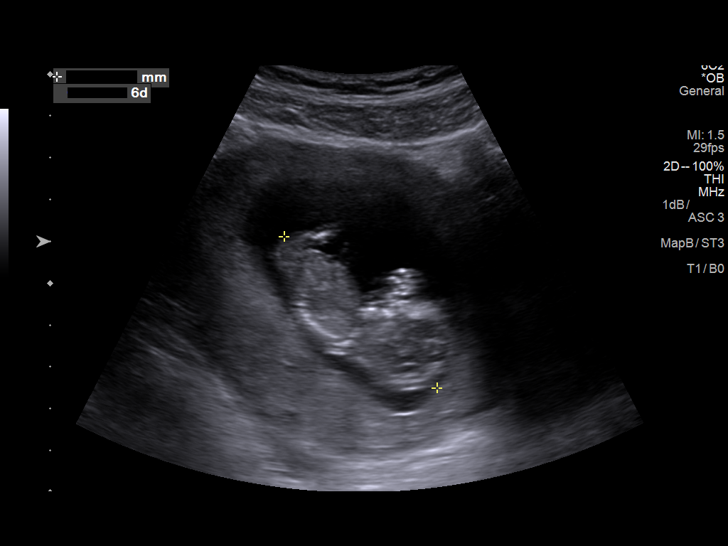
[im 35/43]
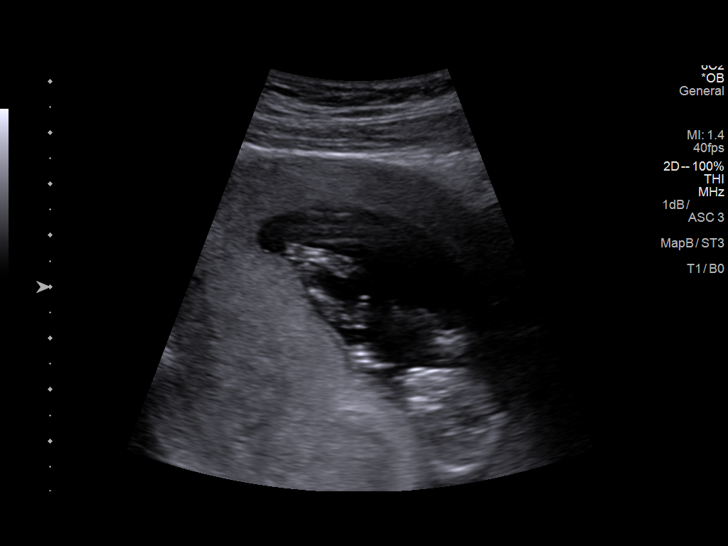
[im 38/43]
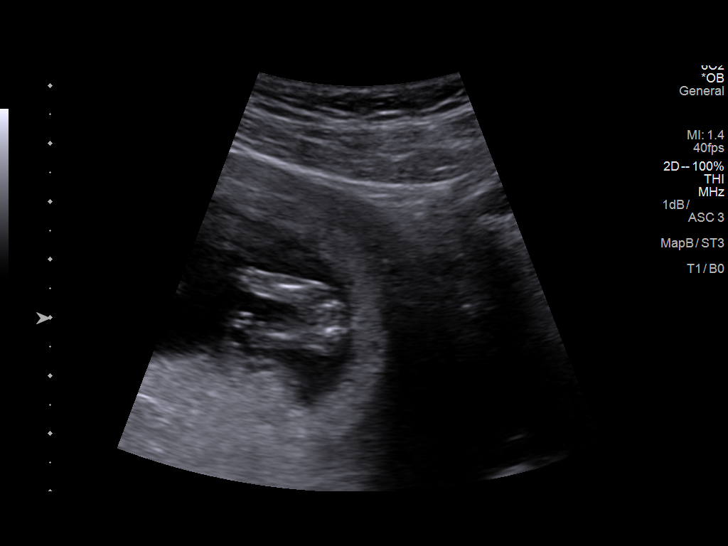
[im 41/43]
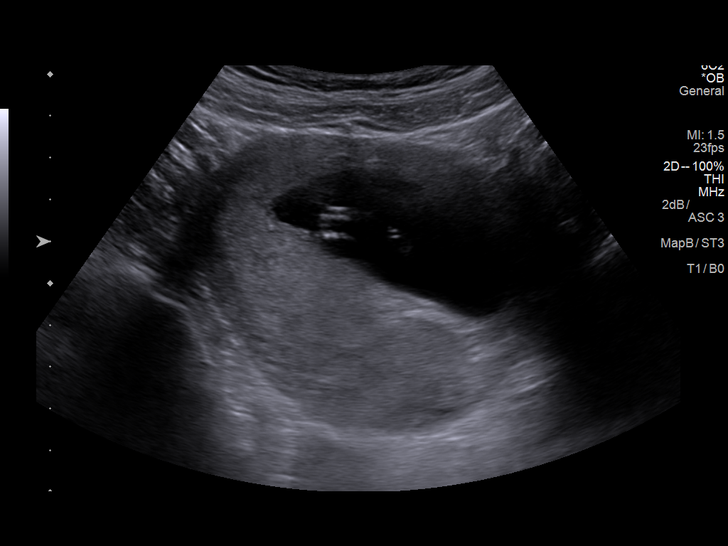

[13 of 28 positions shown; findings below may reference images not displayed]

IMPRESSION: Dear Ms NYA,

 Thank you for referring your patient  for first trimester nuchal
 translucency screening.

 A singleton gestation is noted.

 The fetal biometry correlates with established dating.   11w
 6d by LMP and today's scan

 The nuchal translucency measured 1.3  mm.

 The ovaries were WNL.

 The patient was scheduled to have blood drawn for first
 trimester screening.
 Anatomy ultrasound was scheduled for 18 weeks .

## 2019-07-07 ENCOUNTER — Ambulatory Visit (INDEPENDENT_AMBULATORY_CARE_PROVIDER_SITE_OTHER): Payer: Medicaid Other | Admitting: Certified Nurse Midwife

## 2019-07-07 ENCOUNTER — Other Ambulatory Visit: Payer: Self-pay

## 2019-07-07 ENCOUNTER — Other Ambulatory Visit: Payer: Medicaid Other

## 2019-07-07 VITALS — BP 110/70 | Wt 154.0 lb

## 2019-07-07 DIAGNOSIS — Z3482 Encounter for supervision of other normal pregnancy, second trimester: Secondary | ICD-10-CM

## 2019-07-07 DIAGNOSIS — Z3A27 27 weeks gestation of pregnancy: Secondary | ICD-10-CM | POA: Diagnosis not present

## 2019-07-07 LAB — POCT URINALYSIS DIPSTICK OB
Glucose, UA: NEGATIVE
POC,PROTEIN,UA: NEGATIVE

## 2019-07-07 MED ORDER — RHO D IMMUNE GLOBULIN 1500 UNIT/2ML IJ SOSY
300.0000 ug | PREFILLED_SYRINGE | Freq: Once | INTRAMUSCULAR | Status: AC
  Administered 2019-07-07: 300 ug via INTRAMUSCULAR

## 2019-07-07 NOTE — Addendum Note (Signed)
Addended by: Ova Freshwater R on: 07/07/2019 10:11 AM   Modules accepted: Orders

## 2019-07-08 LAB — 28 WEEKS RH-PANEL
Antibody Screen: NEGATIVE
Basophils Absolute: 0.1 10*3/uL (ref 0.0–0.2)
Basos: 0 %
EOS (ABSOLUTE): 0.2 10*3/uL (ref 0.0–0.4)
Eos: 1 %
Gestational Diabetes Screen: 118 mg/dL (ref 65–139)
HIV Screen 4th Generation wRfx: NONREACTIVE
Hematocrit: 35.3 % (ref 34.0–46.6)
Hemoglobin: 12.4 g/dL (ref 11.1–15.9)
Immature Grans (Abs): 0.2 10*3/uL — ABNORMAL HIGH (ref 0.0–0.1)
Immature Granulocytes: 1 %
Lymphocytes Absolute: 2.3 10*3/uL (ref 0.7–3.1)
Lymphs: 14 %
MCH: 32 pg (ref 26.6–33.0)
MCHC: 35.1 g/dL (ref 31.5–35.7)
MCV: 91 fL (ref 79–97)
Monocytes Absolute: 0.9 10*3/uL (ref 0.1–0.9)
Monocytes: 5 %
Neutrophils Absolute: 13.4 10*3/uL — ABNORMAL HIGH (ref 1.4–7.0)
Neutrophils: 79 %
Platelets: 228 10*3/uL (ref 150–450)
RBC: 3.87 x10E6/uL (ref 3.77–5.28)
RDW: 12 % (ref 11.7–15.4)
RPR Ser Ql: NONREACTIVE
WBC: 17 10*3/uL — ABNORMAL HIGH (ref 3.4–10.8)

## 2019-07-11 ENCOUNTER — Encounter: Payer: Self-pay | Admitting: Certified Nurse Midwife

## 2019-07-11 NOTE — Progress Notes (Signed)
ROB at 27 weeks: Doing well. Baby active. No vaginal bleeding or leakage of fluid.  Having 28 week labs today.  Exam BP 110/70 FH 30cm FHT 134 TWG 29# A Negative blood type  A: IUP at 27 weeks RH negative  P: Rhogam today RTO 3 weeks Breast/ Nexplanon  Farrel Conners, CNM

## 2019-07-29 ENCOUNTER — Encounter: Payer: Medicaid Other | Admitting: Certified Nurse Midwife

## 2019-08-03 ENCOUNTER — Encounter: Payer: Medicaid Other | Admitting: Obstetrics and Gynecology

## 2019-08-13 ENCOUNTER — Other Ambulatory Visit: Payer: Self-pay

## 2019-08-13 ENCOUNTER — Ambulatory Visit (INDEPENDENT_AMBULATORY_CARE_PROVIDER_SITE_OTHER): Payer: BC Managed Care – PPO | Admitting: Obstetrics

## 2019-08-13 VITALS — BP 120/80 | Wt 162.0 lb

## 2019-08-13 DIAGNOSIS — Z23 Encounter for immunization: Secondary | ICD-10-CM

## 2019-08-13 DIAGNOSIS — Z3493 Encounter for supervision of normal pregnancy, unspecified, third trimester: Secondary | ICD-10-CM

## 2019-08-13 DIAGNOSIS — Z3A32 32 weeks gestation of pregnancy: Secondary | ICD-10-CM

## 2019-08-13 DIAGNOSIS — Z3490 Encounter for supervision of normal pregnancy, unspecified, unspecified trimester: Secondary | ICD-10-CM

## 2019-08-13 LAB — POCT URINALYSIS DIPSTICK OB
Glucose, UA: NEGATIVE
POC,PROTEIN,UA: NEGATIVE

## 2019-08-13 NOTE — Patient Instructions (Signed)

## 2019-08-13 NOTE — Progress Notes (Signed)
Missed last two appointments. "Pregnancy brain" per her report. Denies any danger sxs. Baby moves well. Has named her baby, "Melissa Barr" O: See Vs and charting above A: IUP 32 weeks 2 days     RH negative- received her Rhogam. P: Reminded of visits every 2 weeks until 36 weeks. RTC in 2 weeks. tdap given today. Mirna Mires, CNM  08/13/2019 4:16 PM

## 2019-08-19 IMAGING — US US MFM OB DETAIL+14 WK
1 series · 12 of 28 positions shown · non-contrast
Comparison: none

PATIENT INFO:

PERFORMED BY:
                   Sonographer
                   GUDIEL CNM
SERVICE(S) PROVIDED:
 ----------------------------------------------------------------------
INDICATIONS:
  19 weeks gestation of pregnancy
FETAL EVALUATION:
 Num Of Fetuses:         1
 Fetal Heart Rate(bpm):  149
 Cardiac Activity:       Present
 Presentation:           Cephalic
 Placenta:               Posterior
                             Largest Pocket(cm)
BIOMETRY:
 BPD:      42.1  mm     G. Age:  18w 5d         27  %    CI:        72.23   %    70 - 86
                                                         FL/HC:      19.2   %    16.1 -
 HC:      157.6  mm     G. Age:  18w 5d         17  %
                                                         FL/BPD:     71.7   %
 FL:       30.2  mm     G. Age:  19w 3d         45  %
 HUM:        28  mm     G. Age:  19w 0d         44  %
 CER:        21  mm     G. Age:  20w 0d         64  %
 NFT:       4.8  mm
 CM:        3.8  mm
GESTATIONAL AGE:
 LMP:           19w 2d        Date:  10/28/17                 EDD:   08/04/18
 U/S Today:     19w 0d                                        EDD:   08/06/18
 Best:          19w 2d     Det. By:  LMP  (10/28/17)          EDD:   08/04/18
ANATOMY:
 Cranium:               Within Normal Limits   Aortic Arch:            Normal appearance
 Cavum:                 CSP visualized         Ductal Arch:            Normal appearance
 Ventricles:            Normal appearance      Diaphragm:              Within Normal Limits
 Choroid Plexus:        Within Normal Limits   Stomach:                Seen
 Cerebellum:            Within Normal Limits   Abdomen:                Within Normal
                                                                       Limits
 Posterior Fossa:       Within Normal Limits   Abdominal Wall:         Normal appearance
 Nuchal Fold:           Within Normal Limits   Cord Vessels:           3 vessels
 Face:                  Orbits visualized      Kidneys:                Normal appearance
 Lips:                  Normal appearance      Bladder:                Seen
 Thoracic:              Within Normal Limits   Spine:                  Normal appearance
 Heart:                 4-Chamber view         Upper Extremities:      Visualized
                        appears normal
 RVOT:                  Normal appearance      Lower Extremities:      Visualized
 LVOT:                  Normal appearance
CERVIX UTERUS ADNEXA:
 Cervix
 Length:           3.77  cm.
 Adnexa
 WNL

[Series 1: us mfm ob detail+14 wk · 0.20mm/px · 12 of 76 slices shown]
[im 3/76]
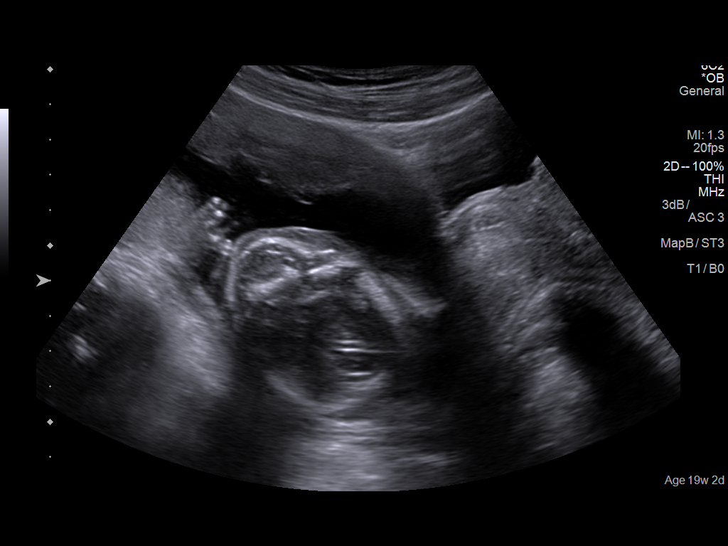
[im 9/76]
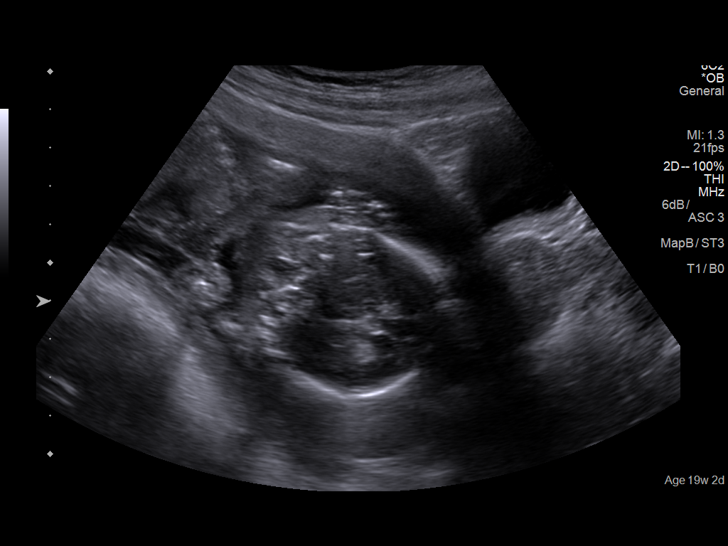
[im 14/76]
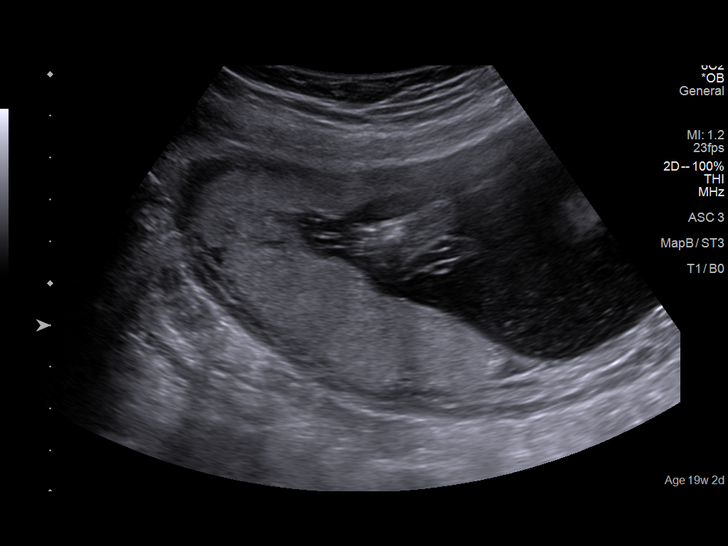
[im 23/76]
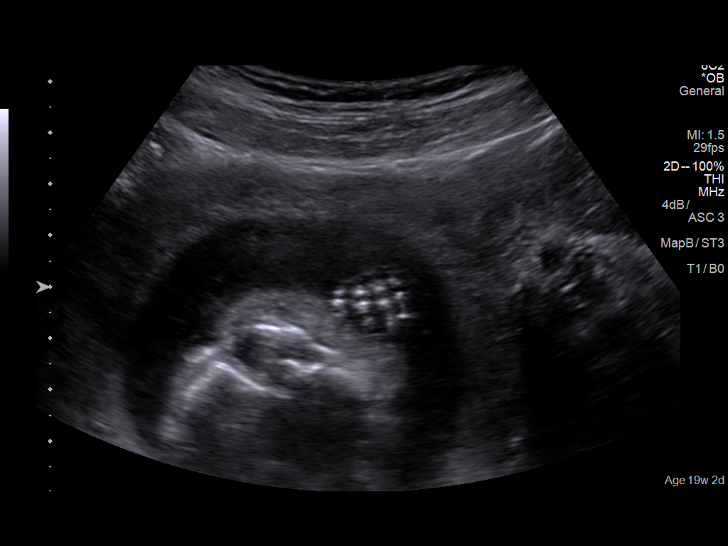
[im 28/76]
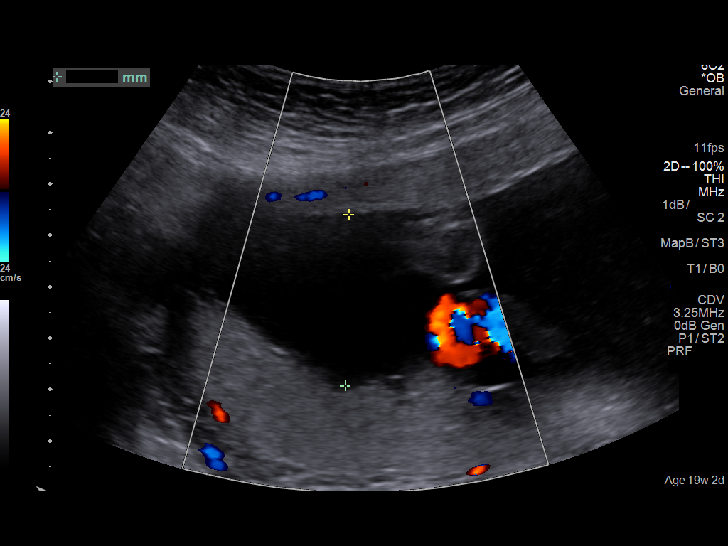
[im 34/76]
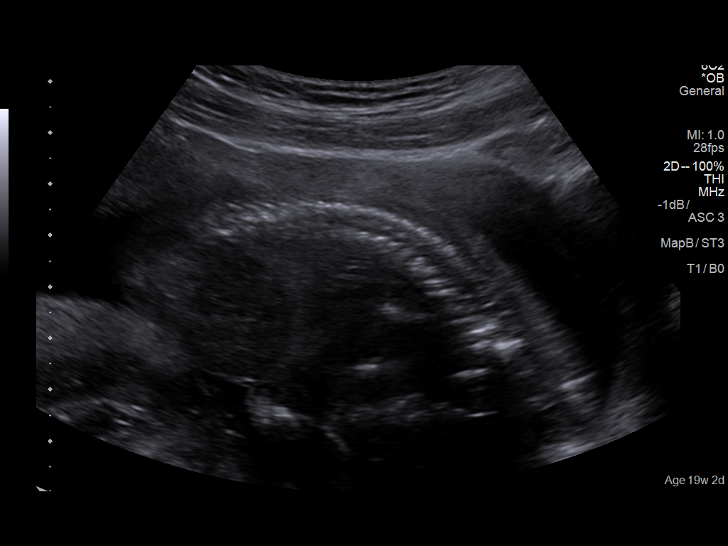
[im 42/76]
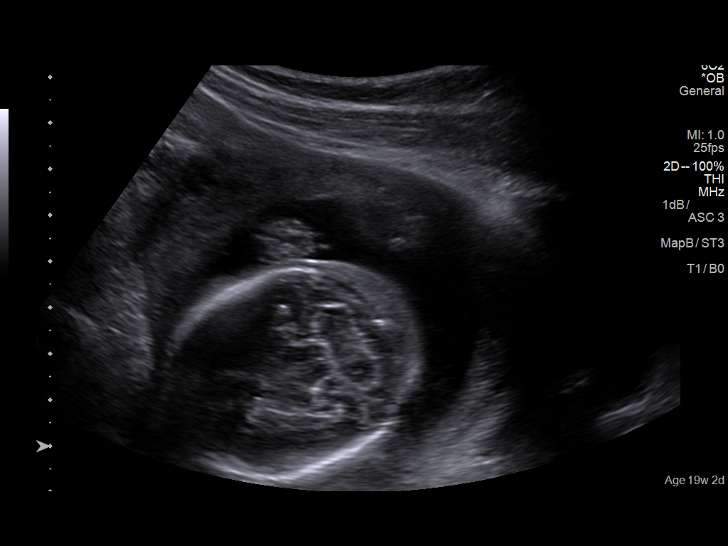
[im 48/76]
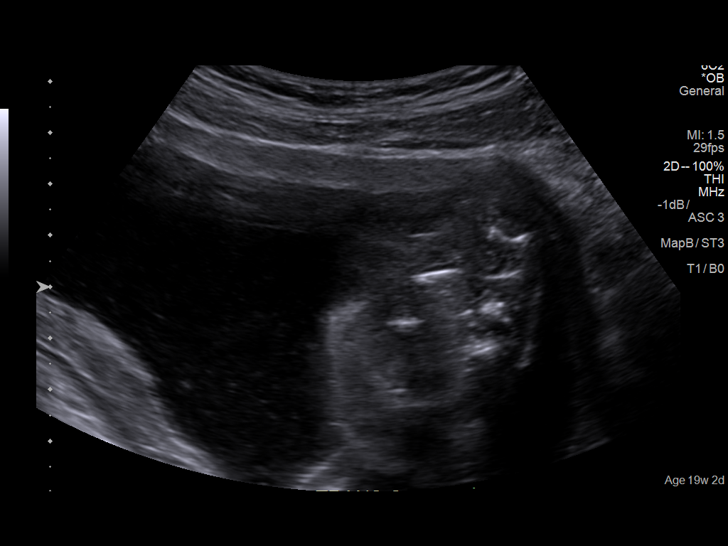
[im 53/76]
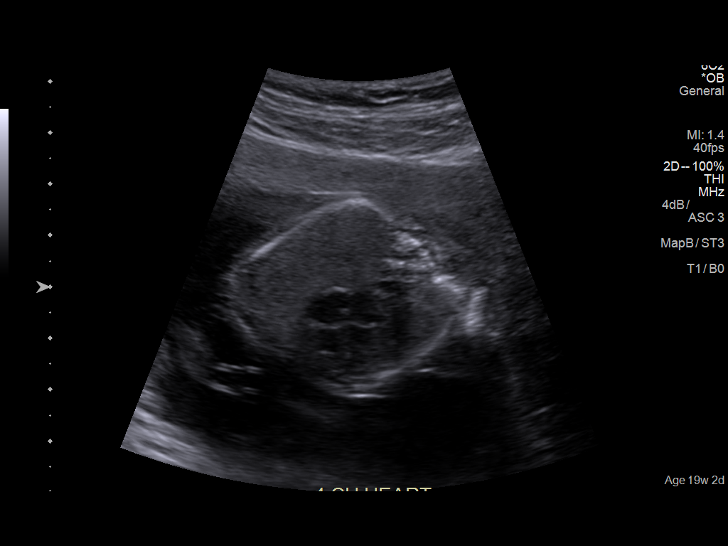
[im 62/76]
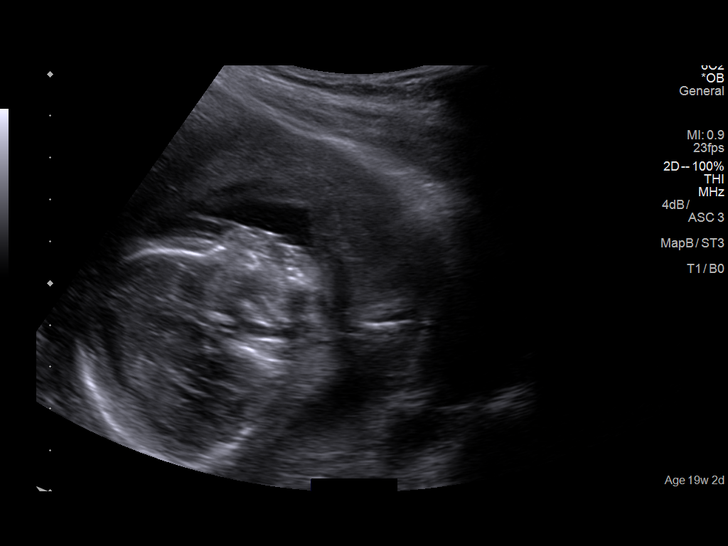
[im 67/76]
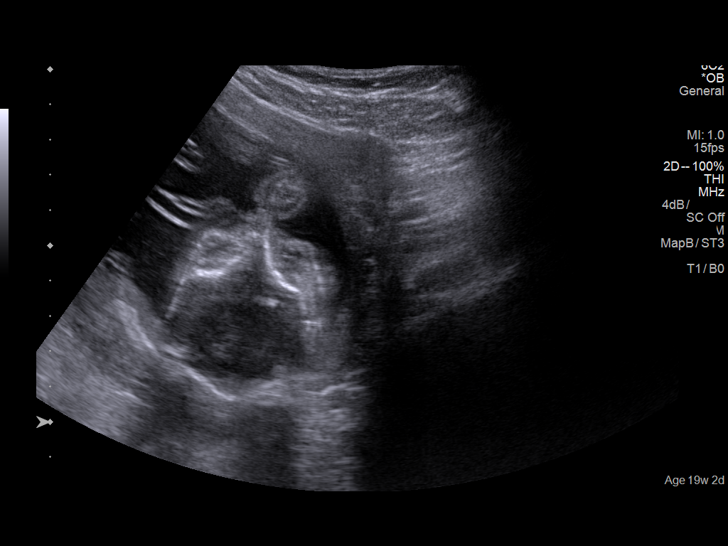
[im 73/76]
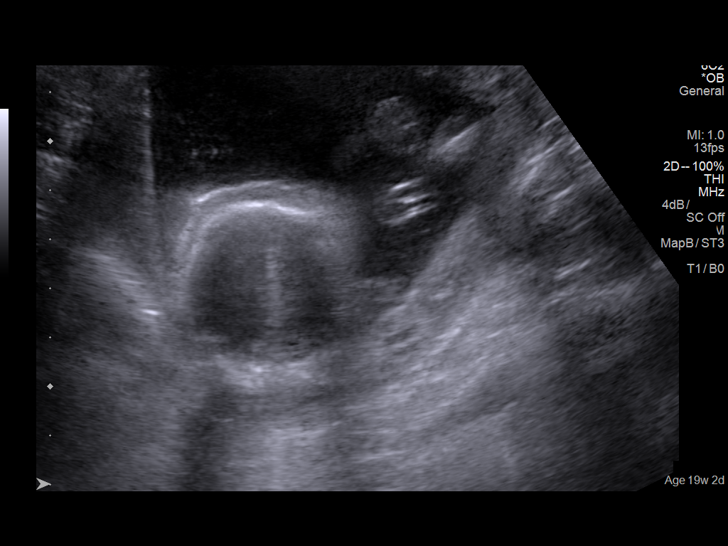

[12 of 28 positions shown; findings below may reference images not displayed]

IMPRESSION: Dear Dr.   GUDIEL,

 Thank you for referring your patient for fetal anatomic survey.
 She previously had genetic counseling and low risk first
 trimester screening.  Dating is by LMP consistent with earliest
 available ultrasound performed at [facility] [HOSPITAL]
 performed on 01/20/19; measurements were consistent with
 11 weeks 6 days.

 Ultrasound demonstrates a  singleton gestation at 19 weeks
 2 days with subjectively normal amniotic fluid volume.

 The fetal biometry correlates with established dating.

 The fetal anatomic survey is unremarkable.  Active fetal
 movements are seen.

 Findings were reviewed.

 Thank you for allowing us to participate in your patient's care.

 assistance.

                  Valenti, Rosa-Anna

## 2019-08-27 ENCOUNTER — Other Ambulatory Visit: Payer: Self-pay

## 2019-08-27 ENCOUNTER — Ambulatory Visit (INDEPENDENT_AMBULATORY_CARE_PROVIDER_SITE_OTHER): Payer: Medicaid Other | Admitting: Obstetrics

## 2019-08-27 VITALS — BP 110/72 | Wt 164.0 lb

## 2019-08-27 DIAGNOSIS — Z3A34 34 weeks gestation of pregnancy: Secondary | ICD-10-CM

## 2019-08-27 DIAGNOSIS — Z3490 Encounter for supervision of normal pregnancy, unspecified, unspecified trimester: Secondary | ICD-10-CM

## 2019-08-27 NOTE — Progress Notes (Signed)
ROB °34 week instructions given °

## 2019-08-27 NOTE — Progress Notes (Signed)
Here at 34 weeks 2 days with no complaints. Her baby is active. She denies contractions, LOF or vag bleeding. She deneis any danger sxs.  Has named her baby "Elisha Ponder" O: see VS A: IUP [redacted]w[redacted]d P: RTC in 2 weeks for ROB and GBS. Encouraged to pre register. SXS of active labor discussed today. Mirna Mires, CNM  08/27/2019 3:25 PM

## 2019-09-10 ENCOUNTER — Other Ambulatory Visit: Payer: Self-pay

## 2019-09-10 ENCOUNTER — Encounter: Payer: Self-pay | Admitting: Advanced Practice Midwife

## 2019-09-10 ENCOUNTER — Ambulatory Visit (INDEPENDENT_AMBULATORY_CARE_PROVIDER_SITE_OTHER): Payer: Medicaid Other | Admitting: Advanced Practice Midwife

## 2019-09-10 ENCOUNTER — Other Ambulatory Visit (HOSPITAL_COMMUNITY)
Admission: RE | Admit: 2019-09-10 | Discharge: 2019-09-10 | Disposition: A | Discharge status: Home / Self Care | Payer: BC Managed Care – PPO | Source: Ambulatory Visit | Attending: Advanced Practice Midwife | Admitting: Advanced Practice Midwife

## 2019-09-10 VITALS — BP 100/70 | Ht 62.0 in | Wt 166.4 lb

## 2019-09-10 DIAGNOSIS — Z113 Encounter for screening for infections with a predominantly sexual mode of transmission: Secondary | ICD-10-CM | POA: Diagnosis present

## 2019-09-10 DIAGNOSIS — Z3A36 36 weeks gestation of pregnancy: Secondary | ICD-10-CM

## 2019-09-10 DIAGNOSIS — Z3685 Encounter for antenatal screening for Streptococcus B: Secondary | ICD-10-CM
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
## 2019-09-10 NOTE — Patient Instructions (Signed)
Braxton Hicks Contractions Contractions of the uterus can occur throughout pregnancy, but they are not always a sign that you are in labor. You may have practice contractions called Braxton Hicks contractions. These false labor contractions are sometimes confused with true labor. What are Braxton Hicks contractions? Braxton Hicks contractions are tightening movements that occur in the muscles of the uterus before labor. Unlike true labor contractions, these contractions do not result in opening (dilation) and thinning of the cervix. Toward the end of pregnancy (32-34 weeks), Braxton Hicks contractions can happen more often and may become stronger. These contractions are sometimes difficult to tell apart from true labor because they can be very uncomfortable. You should not feel embarrassed if you go to the hospital with false labor. Sometimes, the only way to tell if you are in true labor is for your health care provider to look for changes in the cervix. The health care provider will do a physical exam and may monitor your contractions. If you are not in true labor, the exam should show that your cervix is not dilating and your water has not broken. If there are no other health problems associated with your pregnancy, it is completely safe for you to be sent home with false labor. You may continue to have Braxton Hicks contractions until you go into true labor. How to tell the difference between true labor and false labor True labor  Contractions last 30-70 seconds.  Contractions become very regular.  Discomfort is usually felt in the top of the uterus, and it spreads to the lower abdomen and low back.  Contractions do not go away with walking.  Contractions usually become more intense and increase in frequency.  The cervix dilates and gets thinner. False labor  Contractions are usually shorter and not as strong as true labor contractions.  Contractions are usually irregular.  Contractions  are often felt in the front of the lower abdomen and in the groin.  Contractions may go away when you walk around or change positions while lying down.  Contractions get weaker and are shorter-lasting as time goes on.  The cervix usually does not dilate or become thin. Follow these instructions at home:   Take over-the-counter and prescription medicines only as told by your health care provider.  Keep up with your usual exercises and follow other instructions from your health care provider.  Eat and drink lightly if you think you are going into labor.  If Braxton Hicks contractions are making you uncomfortable: ? Change your position from lying down or resting to walking, or change from walking to resting. ? Sit and rest in a tub of warm water. ? Drink enough fluid to keep your urine pale yellow. Dehydration may cause these contractions. ? Do slow and deep breathing several times an hour.  Keep all follow-up prenatal visits as told by your health care provider. This is important. Contact a health care provider if:  You have a fever.  You have continuous pain in your abdomen. Get help right away if:  Your contractions become stronger, more regular, and closer together.  You have fluid leaking or gushing from your vagina.  You pass blood-tinged mucus (bloody show).  You have bleeding from your vagina.  You have low back pain that you never had before.  You feel your baby's head pushing down and causing pelvic pressure.  Your baby is not moving inside you as much as it used to. Summary  Contractions that occur before labor are   called Braxton Hicks contractions, false labor, or practice contractions.  Braxton Hicks contractions are usually shorter, weaker, farther apart, and less regular than true labor contractions. True labor contractions usually become progressively stronger and regular, and they become more frequent.  Manage discomfort from Braxton Hicks contractions  by changing position, resting in a warm bath, drinking plenty of water, or practicing deep breathing. This information is not intended to replace advice given to you by your health care provider. Make sure you discuss any questions you have with your health care provider. Document Revised: 01/17/2017 Document Reviewed: 06/20/2016 Elsevier Patient Education  2020 [employer] Vaginal Delivery  Vaginal delivery means that you give birth by pushing your baby out of your birth canal (vagina). A team of health care providers will help you before, during, and after vaginal delivery. Birth experiences are unique for every woman and every pregnancy, and birth experiences vary depending on where you choose to give birth. What happens when I arrive at the birth center or hospital? Once you are in labor and have been admitted into the hospital or birth center, your health care provider may:  Review your pregnancy history and any concerns that you have.  Insert an IV into one of your veins. This may be used to give you fluids and medicines.  Check your blood pressure, pulse, temperature, and heart rate (vital signs).  Check whether your bag of water (amniotic sac) has broken (ruptured).  Talk with you about your birth plan and discuss pain control options. Monitoring Your health care provider may monitor your contractions (uterine monitoring) and your baby's heart rate (fetal monitoring). You may need to be monitored:  Often, but not continuously (intermittently).  All the time or for long periods at a time (continuously). Continuous monitoring may be needed if: ? You are taking certain medicines, such as medicine to relieve pain or make your contractions stronger. ? You have pregnancy or labor complications. Monitoring may be done by:  Placing a special stethoscope or a handheld monitoring device on your abdomen to check your baby's heartbeat and to check for contractions.  Placing monitors on  your abdomen (external monitors) to record your baby's heartbeat and the frequency and length of contractions.  Placing monitors inside your uterus through your vagina (internal monitors) to record your baby's heartbeat and the frequency, length, and strength of your contractions. Depending on the type of monitor, it may remain in your uterus or on your baby's head until birth.  Telemetry. This is a type of continuous monitoring that can be done with external or internal monitors. Instead of having to stay in bed, you are able to move around during telemetry. Physical exam Your health care provider may perform frequent physical exams. This may include:  Checking how and where your baby is positioned in your uterus.  Checking your cervix to determine: ? Whether it is thinning out (effacing). ? Whether it is opening up (dilating). What happens during labor and delivery?  Normal labor and delivery is divided into the following three stages: Stage 1  This is the longest stage of labor.  This stage can last for hours or days.  Throughout this stage, you will feel contractions. Contractions generally feel mild, infrequent, and irregular at first. They get stronger, more frequent (about every 2-3 minutes), and more regular as you move through this stage.  This stage ends when your cervix is completely dilated to 4 inches (10 cm) and completely effaced. Stage 2  This stage   starts once your cervix is completely effaced and dilated and lasts until the delivery of your baby.  This stage may last from 20 minutes to 2 hours.  This is the stage where you will feel an urge to push your baby out of your vagina.  You may feel stretching and burning pain, especially when the widest part of your baby's head passes through the vaginal opening (crowning).  Once your baby is delivered, the umbilical cord will be clamped and cut. This usually occurs after waiting a period of 1-2 minutes after  delivery.  Your baby will be placed on your bare chest (skin-to-skin contact) in an upright position and covered with a warm blanket. Watch your baby for feeding cues, like rooting or sucking, and help the baby to your breast for his or her first feeding. Stage 3  This stage starts immediately after the birth of your baby and ends after you deliver the placenta.  This stage may take anywhere from 5 to 30 minutes.  After your baby has been delivered, you will feel contractions as your body expels the placenta and your uterus contracts to control bleeding. What can I expect after labor and delivery?  After labor is over, you and your baby will be monitored closely until you are ready to go home to ensure that you are both healthy. Your health care team will teach you how to care for yourself and your baby.  You and your baby will stay in the same room (rooming in) during your hospital stay. This will encourage early bonding and successful breastfeeding.  You may continue to receive fluids and medicines through an IV.  Your uterus will be checked and massaged regularly (fundal massage).  You will have some soreness and pain in your abdomen, vagina, and the area of skin between your vaginal opening and your anus (perineum).  If an incision was made near your vagina (episiotomy) or if you had some vaginal tearing during delivery, cold compresses may be placed on your episiotomy or your tear. This helps to reduce pain and swelling.  You may be given a squirt bottle to use instead of wiping when you go to the bathroom. To use the squirt bottle, follow these steps: ? Before you urinate, fill the squirt bottle with warm water. Do not use hot water. ? After you urinate, while you are sitting on the toilet, use the squirt bottle to rinse the area around your urethra and vaginal opening. This rinses away any urine and blood. ? Fill the squirt bottle with clean water every time you use the  bathroom.  It is normal to have vaginal bleeding after delivery. Wear a sanitary pad for vaginal bleeding and discharge. Summary  Vaginal delivery means that you will give birth by pushing your baby out of your birth canal (vagina).  Your health care provider may monitor your contractions (uterine monitoring) and your baby's heart rate (fetal monitoring).  Your health care provider may perform a physical exam.  Normal labor and delivery is divided into three stages.  After labor is over, you and your baby will be monitored closely until you are ready to go home. This information is not intended to replace advice given to you by your health care provider. Make sure you discuss any questions you have with your health care provider. Document Revised: 03/11/2017 Document Reviewed: 03/11/2017 Elsevier Patient Education  2020 Elsevier Inc.  

## 2019-09-12 LAB — STREP GP B NAA: Strep Gp B NAA: NEGATIVE

## 2019-09-14 LAB — CERVICOVAGINAL ANCILLARY ONLY
Chlamydia: NEGATIVE
Comment: NEGATIVE
Comment: NEGATIVE
Comment: NORMAL
Neisseria Gonorrhea: NEGATIVE
Trichomonas: NEGATIVE

## 2019-09-20 ENCOUNTER — Other Ambulatory Visit: Payer: Self-pay

## 2019-09-20 ENCOUNTER — Ambulatory Visit (INDEPENDENT_AMBULATORY_CARE_PROVIDER_SITE_OTHER): Payer: Medicaid Other | Admitting: Obstetrics

## 2019-09-20 VITALS — BP 100/70 | Wt 166.0 lb

## 2019-09-20 DIAGNOSIS — Z3A37 37 weeks gestation of pregnancy: Secondary | ICD-10-CM

## 2019-09-20 DIAGNOSIS — Z3490 Encounter for supervision of normal pregnancy, unspecified, unspecified trimester: Secondary | ICD-10-CM

## 2019-09-20 DIAGNOSIS — Z3482 Encounter for supervision of other normal pregnancy, second trimester: Secondary | ICD-10-CM
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
## 2019-09-27 ENCOUNTER — Encounter: Payer: Medicaid Other | Admitting: Obstetrics & Gynecology

## 2019-09-29 ENCOUNTER — Inpatient Hospital Stay
Admission: EM | Admit: 2019-09-29 | Discharge: 2019-09-30 | DRG: 806 | Disposition: A | Discharge status: Home / Self Care | Payer: BC Managed Care – PPO | Attending: Obstetrics and Gynecology | Admitting: Obstetrics and Gynecology

## 2019-09-29 ENCOUNTER — Encounter: Payer: Self-pay | Admitting: Obstetrics

## 2019-09-29 ENCOUNTER — Other Ambulatory Visit: Payer: Self-pay

## 2019-09-29 DIAGNOSIS — D62 Acute posthemorrhagic anemia: Secondary | ICD-10-CM | POA: Diagnosis not present

## 2019-09-29 DIAGNOSIS — Z20822 Contact with and (suspected) exposure to covid-19: Secondary | ICD-10-CM | POA: Diagnosis not present

## 2019-09-29 DIAGNOSIS — Z3A39 39 weeks gestation of pregnancy: Secondary | ICD-10-CM | POA: Diagnosis not present

## 2019-09-29 DIAGNOSIS — O9081 Anemia of the puerperium: Secondary | ICD-10-CM | POA: Diagnosis not present

## 2019-09-29 DIAGNOSIS — O26893 Other specified pregnancy related conditions, third trimester: Secondary | ICD-10-CM | POA: Diagnosis not present

## 2019-09-29 DIAGNOSIS — Z87891 Personal history of nicotine dependence: Secondary | ICD-10-CM | POA: Diagnosis not present

## 2019-09-29 LAB — CBC
HCT: 38.3 % (ref 36.0–46.0)
Hemoglobin: 12.8 g/dL (ref 12.0–15.0)
MCH: 31.3 pg (ref 26.0–34.0)
MCHC: 33.4 g/dL (ref 30.0–36.0)
MCV: 93.6 fL (ref 80.0–100.0)
Platelets: 216 10*3/uL (ref 150–400)
RBC: 4.09 MIL/uL (ref 3.87–5.11)
RDW: 13 % (ref 11.5–15.5)
WBC: 16.1 10*3/uL — ABNORMAL HIGH (ref 4.0–10.5)
nRBC: 0 % (ref 0.0–0.2)

## 2019-09-29 LAB — TYPE AND SCREEN
ABO/RH(D): A NEG
Antibody Screen: POSITIVE

## 2019-09-29 LAB — SARS CORONAVIRUS 2 BY RT PCR (HOSPITAL ORDER, PERFORMED IN ~~LOC~~ HOSPITAL LAB): SARS Coronavirus 2: NEGATIVE

## 2019-09-29 MED ORDER — BENZOCAINE-MENTHOL 20-0.5 % EX AERO
1.0000 "application " | INHALATION_SPRAY | CUTANEOUS | Status: DC | PRN
  Filled 2019-09-29: qty 56

## 2019-09-29 MED ORDER — OXYCODONE-ACETAMINOPHEN 5-325 MG PO TABS: 2.0000 | ORAL_TABLET | ORAL | Status: DC | PRN

## 2019-09-29 MED ORDER — ACETAMINOPHEN 325 MG PO TABS: 650.0000 mg | ORAL_TABLET | ORAL | Status: DC | PRN

## 2019-09-29 MED ORDER — DIPHENHYDRAMINE HCL 25 MG PO CAPS: 25.0000 mg | ORAL_CAPSULE | Freq: Four times a day (QID) | ORAL | Status: DC | PRN

## 2019-09-29 MED ORDER — LACTATED RINGERS IV SOLN
INTRAVENOUS | Status: DC
  Administered 2019-09-29: 1000 mL via INTRAVENOUS

## 2019-09-29 MED ORDER — DOCUSATE SODIUM 100 MG PO CAPS
100.0000 mg | ORAL_CAPSULE | Freq: Two times a day (BID) | ORAL | Status: DC
  Administered 2019-09-29 – 2019-09-30 (×2): 100 mg via ORAL
  Filled 2019-09-29 (×2): qty 1

## 2019-09-29 MED ORDER — MISOPROSTOL 200 MCG PO TABS
ORAL_TABLET | ORAL | Status: AC
  Filled 2019-09-29: qty 4

## 2019-09-29 MED ORDER — ONDANSETRON HCL 4 MG PO TABS: 4.0000 mg | ORAL_TABLET | ORAL | Status: DC | PRN

## 2019-09-29 MED ORDER — LIDOCAINE HCL (PF) 1 % IJ SOLN
30.0000 mL | INTRAMUSCULAR | Status: AC | PRN
  Administered 2019-09-29: 30 mL via SUBCUTANEOUS
  Filled 2019-09-29: qty 30

## 2019-09-29 MED ORDER — OXYCODONE HCL 5 MG PO TABS: 5.0000 mg | ORAL_TABLET | ORAL | Status: DC | PRN

## 2019-09-29 MED ORDER — SOD CITRATE-CITRIC ACID 500-334 MG/5ML PO SOLN: 30.0000 mL | ORAL | Status: DC | PRN

## 2019-09-29 MED ORDER — OXYCODONE HCL 5 MG PO TABS: 10.0000 mg | ORAL_TABLET | ORAL | Status: DC | PRN

## 2019-09-29 MED ORDER — WITCH HAZEL-GLYCERIN EX PADS: 1.0000 "application " | MEDICATED_PAD | CUTANEOUS | Status: DC | PRN

## 2019-09-29 MED ORDER — COCONUT OIL OIL: 1.0000 "application " | TOPICAL_OIL | Status: DC | PRN

## 2019-09-29 MED ORDER — OXYTOCIN BOLUS FROM INFUSION
333.0000 mL | Freq: Once | INTRAVENOUS | Status: AC
  Administered 2019-09-29: 333 mL via INTRAVENOUS

## 2019-09-29 MED ORDER — IBUPROFEN 600 MG PO TABS
600.0000 mg | ORAL_TABLET | Freq: Four times a day (QID) | ORAL | Status: DC
  Administered 2019-09-29 – 2019-09-30 (×3): 600 mg via ORAL
  Filled 2019-09-29 (×3): qty 1

## 2019-09-29 MED ORDER — OXYTOCIN-SODIUM CHLORIDE 30-0.9 UT/500ML-% IV SOLN
2.5000 [IU]/h | INTRAVENOUS | Status: DC
  Administered 2019-09-29: 2.5 [IU]/h via INTRAVENOUS
  Filled 2019-09-29: qty 500

## 2019-09-29 MED ORDER — ONDANSETRON HCL 4 MG/2ML IJ SOLN: 4.0000 mg | Freq: Four times a day (QID) | INTRAMUSCULAR | Status: DC | PRN

## 2019-09-29 MED ORDER — LACTATED RINGERS IV SOLN: 500.0000 mL | INTRAVENOUS | Status: DC | PRN

## 2019-09-29 MED ORDER — ZOLPIDEM TARTRATE 5 MG PO TABS: 5.0000 mg | ORAL_TABLET | Freq: Every evening | ORAL | Status: DC | PRN

## 2019-09-29 MED ORDER — PRENATAL MULTIVITAMIN CH
1.0000 | ORAL_TABLET | Freq: Every day | ORAL | Status: DC
  Administered 2019-09-30: 1 via ORAL
  Filled 2019-09-29: qty 1

## 2019-09-29 MED ORDER — SIMETHICONE 80 MG PO CHEW: 80.0000 mg | CHEWABLE_TABLET | ORAL | Status: DC | PRN

## 2019-09-29 MED ORDER — OXYCODONE-ACETAMINOPHEN 5-325 MG PO TABS: 1.0000 | ORAL_TABLET | ORAL | Status: DC | PRN

## 2019-09-29 MED ORDER — ONDANSETRON HCL 4 MG/2ML IJ SOLN: 4.0000 mg | INTRAMUSCULAR | Status: DC | PRN

## 2019-09-29 MED ORDER — DIBUCAINE (PERIANAL) 1 % EX OINT: 1.0000 "application " | TOPICAL_OINTMENT | CUTANEOUS | Status: DC | PRN

## 2019-09-29 NOTE — Plan of Care (Signed)
  Problem: Role Relationship: Goal: Will demonstrate positive interactions with the child Outcome: Progressing   Problem: Education: Goal: Knowledge of General Education information will improve Description: Including pain rating scale, medication(s)/side effects and non-pharmacologic comfort measures Outcome: Progressing

## 2019-09-29 NOTE — OB Triage Note (Signed)
Presents with complaint of contractions that started around 330 this morning. States she lost her mucous plug around this same time. Denies any leaking of fluid or bloody show. EFM applied.

## 2019-09-29 NOTE — H&P (Addendum)
Melissa Barr is a 24 y.o. female presenting for evalaution of labor . She woke up early this morning and had passed her mucous plug.Now c/o regular contractions , q 5 minutes. She denies ROM or vaginal bleeding . Her baby has been moving.  She has not had anything to eat this morning.. OB History    Gravida  2   Para  1   Term  1   Preterm  0   AB  0   Living  1     SAB  0   TAB  0   Ectopic  0   Multiple  0   Live Births  1          Past Medical History:  Diagnosis Date  . Allergy   . Dysmenorrhea   . Esophageal reflux   . Human papilloma virus (HPV) type 9 vaccine administered    gardisil series complete   Past Surgical History:  Procedure Laterality Date  . NO PAST SURGERIES     Family History: family history includes Anemia in her sister; Bladder Cancer (age of onset: 6) in her maternal grandfather; Breast cancer (age of onset: 77) in her maternal aunt; Colon cancer (age of onset: 11) in her maternal uncle; Heart disease in her maternal grandfather, maternal grandmother, and paternal grandfather; Hyperlipidemia in her father; Hypertension in her maternal grandfather, maternal grandmother, and mother; Thyroid cancer in her mother; Uterine cancer (age of onset: 22) in an other family member. Social History:  reports that she quit smoking about 3 years ago. Her smoking use included cigarettes. She has a 0.15 pack-year smoking history. She has never used smokeless tobacco. She reports previous alcohol use. She reports previous drug use.     Maternal Diabetes: no Genetic Screening: Normal Maternal Ultrasounds/Referrals: Normal Fetal Ultrasounds or other Referrals:  None Maternal Substance Abuse:  No Significant Maternal Medications:  None Significant Maternal Lab Results:  Group B Strep negative Other Comments:  None  Review of Systems  Constitutional: Negative.   HENT: Negative.   Eyes: Negative.   Respiratory: Negative.   Cardiovascular: Negative.    Gastrointestinal: Negative.   Endocrine: Negative.   Genitourinary: Negative.   Musculoskeletal: Negative.   Skin: Negative.   Allergic/Immunologic: Negative.   Neurological: Negative.   Hematological: Negative.   Psychiatric/Behavioral: Negative.    History Dilation: 7 Effacement (%): 100 Station: Plus 1 Exam by:: gck Temperature 98.7 F (37.1 C), temperature source Oral, resp. rate 18, height 5\' 2"  (1.575 m), weight 75 kg, last menstrual period 12/30/2018, not currently breastfeeding. Maternal Exam:  Introitus: Normal vulva.   Physical Exam Constitutional:      Appearance: Normal appearance.  HENT:     Head: Normocephalic.     Nose: Nose normal.     Mouth/Throat:     Mouth: Mucous membranes are moist.  Eyes:     Extraocular Movements: Extraocular movements intact.     Pupils: Pupils are equal, round, and reactive to light.  Cardiovascular:     Rate and Rhythm: Normal rate and regular rhythm.     Pulses: Normal pulses.     Heart sounds: Normal heart sounds.  Pulmonary:     Effort: Pulmonary effort is normal.     Breath sounds: Normal breath sounds.  Abdominal:     General: Bowel sounds are normal.     Palpations: Abdomen is soft.  Genitourinary:    General: Normal vulva.  Musculoskeletal:        General:  Normal range of motion.     Cervical back: Normal range of motion and neck supple.  Skin:    General: Skin is warm and dry.  Neurological:     General: No focal deficit present.     Mental Status: She is alert and oriented to person, place, and time.  Psychiatric:        Mood and Affect: Mood normal.        Behavior: Behavior normal.     Prenatal labs: ABO, Rh: A/Negative/-- (02/17 1126) Antibody: Negative (05/19 1114) Rubella: 2.04 (02/17 1126) RPR: Non Reactive (05/19 1114)  HBsAg: Negative (02/17 1126)  HIV: Non Reactive (05/19 1114)  GBS: Negative/-- (07/23 1633)   Assessment/Plan: IUP 39 weeks , active labor, reactive FHTS.  Admit to labor  and Delivery Iv access.  EFM continuous Routine Labs Iv meds for pain or nitrous oxide as she prefers. Anticipate NSVD.  Melissa Barr, CNM  09/29/2019 8:48 AM      Melissa Barr Melissa Barr 09/29/2019, 8:44 AM

## 2019-09-29 NOTE — Discharge Summary (Signed)
   Postpartum Discharge Summary  Date of Service updated: 09/30/2019     Patient Name: Melissa Barr DOB: 04/23/1995 MRN: [id number]  Date of admission: 09/29/2019 Delivery date:09/29/2019  Delivering provider: FRYER, MARGARET M  Date of discharge: 09/30/2019  Admitting diagnosis: [redacted] weeks gestation of pregnancy [Z3A.39] Intrauterine pregnancy: [redacted]w[redacted]d     Secondary diagnosis:  Active Problems:   Normal labor and delivery   [redacted] weeks gestation of pregnancy   Postpartum care following vaginal delivery   Encounter for care or examination of lactating mother   Single liveborn infant delivered vaginally  Additional problems: none    Discharge diagnosis: Term Pregnancy Delivered                                              Post partum procedures:rhogam Augmentation: N/A Complications: None  Hospital course: Onset of Labor With Vaginal Delivery      24 y.o. yo G2P1001 at [redacted]w[redacted]d was admitted in Active Labor on 09/29/2019. Patient had an uncomplicated labor course as follows:  Membrane Rupture Time/Date: 10:43 AM ,09/29/2019   Delivery Method:Vaginal, Spontaneous  Episiotomy:   Lacerations:    Patient had an uncomplicated postpartum course.  She is tolerating a regular diet. Her pain is well controlled with PO medications. She is ambulating and voiding without difficulty. Patient is discharged home in stable condition on 09/30/19.  Newborn Data: Birth date:09/29/2019  Birth time:12:04 PM  Gender:Female  Marcellus Living status:Living  Apgars:8 ,9  Weight:3690 g   Magnesium Sulfate received: No BMZ received: No Rhophylac:Yes MMR:No T-DaP:Given prenatally Flu: N/A Transfusion:No  Physical exam  Vitals:   09/29/19 2025 09/29/19 2319 09/30/19 0449 09/30/19 0739  BP: (!) 99/56 102/60 (!) 97/58 (!) 97/55  Pulse: (!) 101 (!) 104 82 89  Resp: 20 16 16 18  Temp: 99.6 F (37.6 C) 99.1 F (37.3 C) 98.2 F (36.8 C) 98.1 F (36.7 C)  TempSrc: Oral Oral Oral Oral  SpO2: 98% 98% 99%  98%  Weight:      Height:       General: alert, cooperative and no distress Lochia: appropriate Uterine Fundus: firm Incision: N/A DVT Evaluation: No evidence of DVT seen on physical exam. Labs: Lab Results  Component Value Date   WBC 16.8 (H) 09/30/2019   HGB 10.8 (L) 09/30/2019   HCT 31.5 (L) 09/30/2019   MCV 92.1 09/30/2019   PLT 200 09/30/2019   CMP Latest Ref Rng & Units 06/21/2014  Glucose 65 - 99 mg/dL 112(H)  BUN 6 - 20 mg/dL 20  Creatinine 0.50 - 1.00 mg/dL 0.77  Sodium 135 - 145 mmol/L 135  Potassium 3.5 - 5.1 mmol/L 3.9  Chloride 101 - 111 mmol/L 106  CO2 22 - 32 mmol/L 21(L)  Calcium 8.9 - 10.3 mg/dL 8.8(L)  Total Protein 6.5 - 8.1 g/dL 7.0  Total Bilirubin 0.3 - 1.2 mg/dL 0.6  Alkaline Phos 47 - 119 U/L 40(L)  AST 15 - 41 U/L 18  ALT 14 - 54 U/L 10(L)   Edinburgh Score: Edinburgh Postnatal Depression Scale Screening Tool 09/30/2019  I have been able to laugh and see the funny side of things. 0  I have looked forward with enjoyment to things. 0  I have blamed myself unnecessarily when things went wrong. 1  I have been anxious or worried for no good reason. 0  I have   felt scared or panicky for no good reason. 0  Things have been getting on top of me. 0  I have been so unhappy that I have had difficulty sleeping. 1  I have felt sad or miserable. 1  I have been so unhappy that I have been crying. 0  The thought of harming myself has occurred to me. 0  Edinburgh Postnatal Depression Scale Total 3      After visit meds:  Allergies as of 09/30/2019      Reactions   Sulfa Antibiotics Shortness Of Breath   rash      Medication List    TAKE these medications   multivitamin-prenatal 27-0.8 MG Tabs tablet Take 1 tablet by mouth daily at 12 noon.        Discharge home in stable condition Infant Feeding: Breast Infant Disposition:home with mother Discharge instruction: per After Visit Summary and Postpartum booklet. Activity: Advance as tolerated.  Pelvic rest for 6 weeks.  Diet: routine diet Anticipated Birth Control: Nexplanon Postpartum Appointment:6 weeks Additional Postpartum F/U: none Future Appointments:No future appointments. Follow up Visit:  Follow-up Information    Fryer, Margaret M, CNM. Schedule an appointment as soon as possible for a visit in 6 week(s).   Specialties: Obstetrics, Gynecology Why: Please confirm with the [facility] office for your appointment in 6 weeks. Let them know that you plan on getting a Nexplanon at that appointment. Contact information: [address] [place] [address] [phone]               Jane E. Gledhill, CNM [facility] 09/30/2019, 11:13 AM      

## 2019-09-29 NOTE — Progress Notes (Signed)
Melissa Barr is a 24 y.o. G2P1001 at [redacted]w[redacted]d by ultrasound admitted for active labor  Subjective: She is laboring without pain medication . Feeling some rectal pressure. Tired nitrous oxide but has had difficulty keeping the mask to her face. Her mother is at the bedside providing suppport.  Objective: BP 108/63 (BP Location: Right Arm)   Pulse 95   Temp 98.7 F (37.1 C) (Oral)   Resp 18   Ht 5\' 2"  (1.575 m)   Wt 75 kg   LMP 12/30/2018 Comment: normal  BMI 30.24 kg/m  No intake/output data recorded. No intake/output data recorded.  FHT:  FHR: 140 bpm, variability: moderate,  accelerations:  Present,  decelerations: occasional variable decle noted UC:   regular, every 3 minutes SVE:   Dilation: Lip/rim Effacement (%): 100 Station: Plus 1 Exam by:: [email or web address]: Lab Results  Component Value Date   WBC 16.1 (H) 09/29/2019   HGB 12.8 09/29/2019   HCT 38.3 09/29/2019   MCV 93.6 09/29/2019   PLT 216 09/29/2019    Assessment / Plan: Spontaneous labor, progressing normally  Labor: Progressing normally  Fetal Wellbeing:  Category II Pain Control:  Labor support without medications I/D:  n/a Anticipated MOD:  NSVD  Delivery table is set up. Bedside labor support by CNM and family. Dr. 11/29/2019 notified of impending birth.  Tiburcio Pea, CNM  09/29/2019 11:05 AM    11/29/2019 09/29/2019, 11:02 AM

## 2019-09-30 LAB — CBC
HCT: 31.5 % — ABNORMAL LOW (ref 36.0–46.0)
Hemoglobin: 10.8 g/dL — ABNORMAL LOW (ref 12.0–15.0)
MCH: 31.6 pg (ref 26.0–34.0)
MCHC: 34.3 g/dL (ref 30.0–36.0)
MCV: 92.1 fL (ref 80.0–100.0)
Platelets: 200 10*3/uL (ref 150–400)
RBC: 3.42 MIL/uL — ABNORMAL LOW (ref 3.87–5.11)
RDW: 13.2 % (ref 11.5–15.5)
WBC: 16.8 10*3/uL — ABNORMAL HIGH (ref 4.0–10.5)
nRBC: 0 % (ref 0.0–0.2)

## 2019-09-30 LAB — SYPHILIS: RPR W/REFLEX TO RPR TITER AND TREPONEMAL ANTIBODIES, TRADITIONAL SCREENING AND DIAGNOSIS ALGORITHM: RPR Ser Ql: NONREACTIVE

## 2019-09-30 LAB — FETAL SCREEN: Fetal Screen: NEGATIVE

## 2019-09-30 MED ORDER — RHO D IMMUNE GLOBULIN 1500 UNIT/2ML IJ SOSY
300.0000 ug | PREFILLED_SYRINGE | Freq: Once | INTRAMUSCULAR | Status: AC
  Administered 2019-09-30: 300 ug via INTRAVENOUS
  Filled 2019-09-30: qty 2

## 2019-09-30 NOTE — Lactation Note (Signed)
This note was copied from a baby's chart. Lactation Consultation Note  Patient Name: Melissa Barr WCBJS'E Date: 09/30/2019 Reason for consult: Initial assessment;Term  Lactation initial visit. This is mom's second baby, first Melissa. She breast fed her first for 1 month and cites low supply/return to work as main reasons for discontinuing.  Mom reports feeding with baby Melissa has gone well, no pain/discomfort noted, and baby sleepy at times during feedings. Output exceeds expectations, minimal weight loss noted for HOL; indicating feeding to be going well. LC provided breastfeeding education: newborn stomach size, feeding behaviors and patterns, early cues, position/latch, signs of transfer, and feeding on demand. Plan is for baby to be circumcised around 12pm; information given for typical newborn behaviors following procedure.  LC reviewed with mom breast fullness and engorgement, management of both, nipple care, signs of plugged ducts and mastitis and when to seek care. Encouraged to call out for support today and information given for outpatient lactation services post discharge as well as community breastfeeding resources.  Maternal Data Formula Feeding for Exclusion: No Has patient been taught Hand Expression?: Yes Does the patient have breastfeeding experience prior to this delivery?: Yes  Feeding Feeding Type: Breast Fed  LATCH Score                   Interventions Interventions: Breast feeding basics reviewed  Lactation Tools Discussed/Used     Consult Status Consult Status: PRN    Danford Bad 09/30/2019, 11:12 AM

## 2019-09-30 NOTE — Progress Notes (Signed)
Discharge instructions, prescriptions, education, and appointments given and explained. Pt verbalized understanding with no further questions. Pt wheeled to personal vehicle for discharge.

## 2019-09-30 NOTE — Progress Notes (Signed)
Subjective:  Doing well postpartum day 1: she is tolerating regular diet, her pain is controlled with PO medication, she is ambulating and voiding without difficulty. She reports breastfeeding is going well. She would like to discharge to home later today pending newborn screens.  Objective:  Vital signs in last 24 hours: Temp:  [98.1 F (36.7 C)-99.6 F (37.6 C)] 98.1 F (36.7 C) (08/12 0739) Pulse Rate:  [82-127] 89 (08/12 0739) Resp:  [16-20] 18 (08/12 0739) BP: (97-114)/(55-83) 97/55 (08/12 0739) SpO2:  [98 %-99 %] 98 % (08/12 0739)    General: NAD Pulmonary: no increased work of breathing Abdomen: non-distended, non-tender, fundus firm at level of umbilicus Extremities: no edema, no erythema, no tenderness  Results for orders placed or performed during the hospital encounter of 09/29/19 (from the past 72 hour(s))  CBC     Status: Abnormal   Collection Time: 09/29/19  8:59 AM  Result Value Ref Range   WBC 16.1 (H) 4.0 - 10.5 K/uL   RBC 4.09 3.87 - 5.11 MIL/uL   Hemoglobin 12.8 12.0 - 15.0 g/dL   HCT 14.4 36 - 46 %   MCV 93.6 80.0 - 100.0 fL   MCH 31.3 26.0 - 34.0 pg   MCHC 33.4 30.0 - 36.0 g/dL   RDW 31.5 40.0 - 86.7 %   Platelets 216 150 - 400 K/uL   nRBC 0.0 0.0 - 0.2 %    Comment: Performed at [facility] [place], [address] [place], [place] [address]  RPR     Status: None   Collection Time: 09/29/19  8:59 AM  Result Value Ref Range   RPR Ser Ql NON REACTIVE NON REACTIVE    Comment: Performed at [facility], [address], [place], [place] [address]  SARS Coronavirus 2 by RT PCR (hospital order, performed in [facility] hospital lab) Nasopharyngeal Nasopharyngeal Swab     Status: None   Collection Time: 09/29/19  8:59 AM   Specimen: Nasopharyngeal Swab  Result Value Ref Range   SARS Coronavirus 2 NEGATIVE NEGATIVE    Comment: (NOTE) SARS-CoV-2 target nucleic acids are NOT DETECTED.  The SARS-CoV-2 RNA is generally detectable in upper and  lower respiratory specimens during the acute phase of infection. The lowest concentration of SARS-CoV-2 viral copies this assay can detect is 250 copies / mL. A negative result does not preclude SARS-CoV-2 infection and should not be used as the sole basis for treatment or other patient management decisions.  A negative result may occur with improper specimen collection / handling, submission of specimen other than nasopharyngeal swab, presence of viral mutation(s) within the areas targeted by this assay, and inadequate number of viral copies (<250 copies / mL). A negative result must be combined with clinical observations, patient history, and epidemiological information.  Fact Sheet for Patients:   BoilerBrush.com.cy  Fact Sheet for Healthcare Providers: https://pope.com/  This test is not yet approved or  cleared by the [place] FDA and has been authorized for detection and/or diagnosis of SARS-CoV-2 by FDA under an Emergency Use Authorization (EUA).  This EUA will remain in effect (meaning this test can be used) for the duration of the COVID-19 declaration under Section 564(b)(1) of the Act, 21 U.S.C. section 360bbb-3(b)(1), unless the authorization is terminated or revoked sooner.  Performed at [facility], [address] [place], [place] [address]   Type and screen     Status: None   Collection Time: 09/29/19 10:30 AM  Result  Value Ref Range   ABO/RH(D) A NEG    Antibody Screen POS    Sample Expiration 10/02/2019,2359    Antibody Identification      PASSIVELY ACQUIRED ANTI-D Performed at [facility], [address] [place] [place], [place] [address]   Fetal screen     Status: None   Collection Time: 09/30/19  5:03 AM  Result Value Ref Range   Fetal Screen      NEG Performed at [facility], [address] [place], [place] [address]   Rhogam injection     Status: None (Preliminary result)    Collection Time: 09/30/19  5:03 AM  Result Value Ref Range   Unit Number [id number]    Blood Component Type RHIG    Unit division 00    Status of Unit ISSUED    Transfusion Status      OK TO TRANSFUSE Performed at [facility], [address] [place], [place] [address]   CBC     Status: Abnormal   Collection Time: 09/30/19  5:03 AM  Result Value Ref Range   WBC 16.8 (H) 4.0 - 10.5 K/uL   RBC 3.42 (L) 3.87 - 5.11 MIL/uL   Hemoglobin 10.8 (L) 12.0 - 15.0 g/dL   HCT 70.9 (L) 36 - 46 %   MCV 92.1 80.0 - 100.0 fL   MCH 31.6 26.0 - 34.0 pg   MCHC 34.3 30.0 - 36.0 g/dL   RDW 62.8 36.6 - 29.4 %   Platelets 200 150 - 400 K/uL   nRBC 0.0 0.0 - 0.2 %    Comment: Performed at [facility], [address], [place], [place] [address]    Assessment:   24 y.o. G2P1001 postpartum day # 1, lactating  Plan:    1) Acute blood loss anemia - hemodynamically stable and asymptomatic - po ferrous sulfate  2) Blood Type --/--/A NEG (08/11 1030) / Rubella 2.04 (02/17 1126) / Varicella Immune  3) TDAP status up to date  4) Feeding plan breast  5)  Education given regarding options for contraception, as well as compatibility with breast feeding if applicable.  Patient is considering Nexplanon for contraception.  6) Disposition: continue current care with possible discharge later pending newborn screens   Tresea Mall, CNM [facility] 09/30/2019, 11:05 AM

## 2019-10-01 LAB — RHOGAM INJECTION: Unit division: 0

## 2019-10-04 ENCOUNTER — Telehealth (INDEPENDENT_AMBULATORY_CARE_PROVIDER_SITE_OTHER): Payer: BC Managed Care – PPO | Admitting: Family Medicine

## 2019-10-04 DIAGNOSIS — R198 Other specified symptoms and signs involving the digestive system and abdomen: Secondary | ICD-10-CM

## 2019-10-04 DIAGNOSIS — N9489 Other specified conditions associated with female genital organs and menstrual cycle: Secondary | ICD-10-CM

## 2019-10-04 NOTE — Progress Notes (Signed)
MyChart Video Visit    Virtual Visit via Video Note   This visit type was conducted due to national recommendations for restrictions regarding the COVID-19 Pandemic (e.g. social distancing) in an effort to limit this patient's exposure and mitigate transmission in our community. This patient is at least at moderate risk for complications without adequate follow up. This format is felt to be most appropriate for this patient at this time. Physical exam was limited by quality of the video and audio technology used for the visit.    Patient location: home Provider location: [facility] Persons involved in the visit: patient, provider   Patient: Melissa Barr   DOB: 06-27-1995   23 y.o. Female  MRN: [id number] Visit Date: 10/04/2019  Today's healthcare provider: Shirlee Latch, MD   Chief Complaint  Patient presents with   Abdominal Pain   Subjective    Abdominal Pain This is a new problem. The current episode started yesterday. The problem has been gradually worsening. The quality of the pain is cramping. The abdominal pain radiates to the back. Pertinent negatives include no constipation, diarrhea, fever, frequency, headaches, nausea or vomiting. The pain is relieved by nothing. Treatments tried: Ibuprofen. The treatment provided no relief.     Pt states she had a baby five days ago via NSVD, no complications.  Small tear requiring sutures.  She has not contacted her OB/GYN.  She says they wanted her to follow up with her PCP.    Took 2 advil yesterday and felt better Had 3 BMs yesterday More cramping after BM last night  Had BMs before yesterday that were not painful.  They are soft, but formed.  No fever or discharge No blood in stool  Remembers having pain with BMs after 1st baby's delivery (64m ago)  Breastfeeding - cramping in uterus after this, similar to after BM (but that was worse) Lochia is decreasing    Medications: Outpatient  Medications Prior to Visit  Medication Sig   Prenatal Vit-Fe Fumarate-FA (MULTIVITAMIN-PRENATAL) 27-0.8 MG TABS tablet Take 1 tablet by mouth daily at 12 noon.   No facility-administered medications prior to visit.    Review of Systems  Constitutional: Negative.  Negative for fever.  Gastrointestinal: Positive for abdominal distention and abdominal pain. Negative for anal bleeding, blood in stool, constipation, diarrhea, nausea, rectal pain and vomiting.  Genitourinary: Negative for frequency.  Neurological: Negative for dizziness, light-headedness and headaches.      Objective    There were no vitals taken for this visit.   Physical Exam Vitals reviewed.  Constitutional:      General: She is not in acute distress.    Appearance: Normal appearance. She is not diaphoretic.  HENT:     Head: Normocephalic and atraumatic.  Eyes:     Conjunctiva/sclera: Conjunctivae normal.  Pulmonary:     Effort: Pulmonary effort is normal. No respiratory distress.  Neurological:     Mental Status: She is alert and oriented to person, place, and time. Mental status is at baseline.  Psychiatric:        Mood and Affect: Mood normal.        Behavior: Behavior normal.        Assessment & Plan     1. Uterine cramping 2. Pain with bowel movements - discussed that it is common and normal to have uterine cramping with breast-feeding and at other times after delivery -Discussed how the uterus is contracting to go back to its original size -  Discussed that lochia is normal as well and this should slowly decrease over time -Discussed that bowel movements are often uncomfortable after vaginal deliveries, especially when perineal tears present -Advised on keeping the stools soft with Colace -If pain persists or worsens, needs in person exam to evaluate her sutures -If she develops fever or discharge, needs evaluation for possible endometritis -Avoid NSAIDs to avoid decreasing milk supply -Tylenol  as needed -Peribottle as needed   No follow-ups on file.     I discussed the assessment and treatment plan with the patient. The patient was provided an opportunity to ask questions and all were answered. The patient agreed with the plan and demonstrated an understanding of the instructions.   The patient was advised to call back or seek an in-person evaluation if the symptoms worsen or if the condition fails to improve as anticipated.  I provided 20 minutes of non-face-to-face time during this encounter.   I, Shirlee Latch, MD, have reviewed all documentation for this visit. The documentation on 10/04/19 for the exam, diagnosis, procedures, and orders are all accurate and complete.   Melissa Barr, Marzella Schlein, MD, MPH [facility]

## 2019-10-06 ENCOUNTER — Inpatient Hospital Stay: Admit: 2019-10-06 | Payer: Self-pay

## 2019-10-14 ENCOUNTER — Telehealth: Payer: Self-pay | Admitting: Obstetrics

## 2019-10-14 NOTE — Telephone Encounter (Signed)
Noted. Will order to arrive by apt date/time. 

## 2019-10-14 NOTE — Telephone Encounter (Signed)
Patient is scheduled for 11/17/19 at 2 for 6 weeks pp and poss nexplanon placement

## 2019-10-24 ENCOUNTER — Other Ambulatory Visit: Payer: Self-pay

## 2019-10-24 ENCOUNTER — Emergency Department
Admission: EM | Admit: 2019-10-24 | Discharge: 2019-10-24 | Disposition: A | Discharge status: Home / Self Care | Payer: BC Managed Care – PPO | Attending: Emergency Medicine | Admitting: Emergency Medicine

## 2019-10-24 DIAGNOSIS — Z87891 Personal history of nicotine dependence: Secondary | ICD-10-CM | POA: Diagnosis not present

## 2019-10-24 DIAGNOSIS — R21 Rash and other nonspecific skin eruption: Secondary | ICD-10-CM | POA: Diagnosis not present

## 2019-10-24 DIAGNOSIS — Z872 Personal history of diseases of the skin and subcutaneous tissue: Secondary | ICD-10-CM | POA: Diagnosis not present

## 2019-10-24 MED ORDER — CETIRIZINE HCL 10 MG PO TABS: 10.0000 mg | ORAL_TABLET | Freq: Every day | ORAL | 0 refills | Status: DC

## 2019-10-24 MED ORDER — DIPHENHYDRAMINE HCL 50 MG/ML IJ SOLN
50.0000 mg | Freq: Once | INTRAMUSCULAR | Status: AC
  Administered 2019-10-24: 50 mg via INTRAMUSCULAR
  Filled 2019-10-24: qty 1

## 2019-10-24 MED ORDER — PREDNISONE 50 MG PO TABS
50.0000 mg | ORAL_TABLET | Freq: Every day | ORAL | 0 refills | Status: DC
Start: 2019-10-24 — End: 2020-11-06

## 2019-10-24 MED ORDER — HYDROXYZINE PAMOATE 25 MG PO CAPS
25.0000 mg | ORAL_CAPSULE | Freq: Three times a day (TID) | ORAL | 0 refills | Status: DC | PRN
Start: 2019-10-24 — End: 2020-11-06

## 2019-10-24 MED ORDER — DEXAMETHASONE SODIUM PHOSPHATE 10 MG/ML IJ SOLN
10.0000 mg | Freq: Once | INTRAMUSCULAR | Status: AC
  Administered 2019-10-24: 10 mg via INTRAMUSCULAR
  Filled 2019-10-24: qty 1

## 2019-10-24 MED ORDER — TRIAMCINOLONE ACETONIDE 0.1 % EX CREA
1.0000 | TOPICAL_CREAM | Freq: Four times a day (QID) | CUTANEOUS | 1 refills | Status: DC
Start: 2019-10-24 — End: 2020-11-06

## 2019-10-24 NOTE — ED Triage Notes (Signed)
Patient generalized rash to entire body X 1 week.
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
## 2019-11-17 ENCOUNTER — Other Ambulatory Visit: Payer: Self-pay

## 2019-11-17 ENCOUNTER — Ambulatory Visit (INDEPENDENT_AMBULATORY_CARE_PROVIDER_SITE_OTHER): Payer: BC Managed Care – PPO | Admitting: Obstetrics

## 2019-11-17 ENCOUNTER — Encounter: Payer: Self-pay | Admitting: Obstetrics

## 2019-11-17 DIAGNOSIS — Z30017 Encounter for initial prescription of implantable subdermal contraceptive: Secondary | ICD-10-CM

## 2019-11-17 NOTE — Progress Notes (Signed)
Postpartum Visit  Chief Complaint:  Chief Complaint  Patient presents with  . Postpartum Care    hives from stress-seen in hosp a couple weeks ago, mostly arms/legs, itches esp at HS;  itching vag and vulvar/labis    History of Present Illness: Patient is a 24 y.o. G2P1001 presents for postpartum visit.  Date of delivery: 09/29/2019 Type of delivery: Vaginal delivery - Vacuum or forceps assisted  no Episiotomy No.  Laceration: yes  First degree with labia laceration Pregnancy or labor problems:  no Any problems since the delivery:  Yes recent outbreak of body rash , dxed as stress hives by the ED. Breastfeeding - going well. The FOB is incarcerated for up to 3 years.  Newborn Details:  SINGLETON :  1. Baby's name: Boy Marcellus. Birth weight: 3690 gms Maternal Details:  Breast Feeding:  yes Post partum depression/anxiety noted:  no Edinburgh Post-Partum Depression Score:  5  Date of last PAP: *02/24/2019 normal   Past Medical History:  Diagnosis Date  . Allergy   . Dysmenorrhea   . Esophageal reflux   . Human papilloma virus (HPV) type 9 vaccine administered    gardisil series complete  . Normal labor 09/29/2019   Admitted 8/11 in active labor    Past Surgical History:  Procedure Laterality Date  . NO PAST SURGERIES      Prior to Admission medications   Medication Sig Start Date End Date Taking? Authorizing Provider  cetirizine (ZYRTEC) 10 MG tablet Take 1 tablet (10 mg total) by mouth daily. 10/24/19  Yes Cuthriell, Delorise Royals, PA-C  Prenatal Vit-Fe Fumarate-FA (MULTIVITAMIN-PRENATAL) 27-0.8 MG TABS tablet Take 1 tablet by mouth daily at 12 noon.   Yes [provider]  triamcinolone cream (KENALOG) 0.1 % Apply 1 application topically 4 (four) times daily. 10/24/19  Yes Cuthriell, Delorise Royals, PA-C  hydrOXYzine (VISTARIL) 25 MG capsule Take 1 capsule (25 mg total) by mouth 3 (three) times daily as needed. Patient not taking: Reported on 11/17/2019 10/24/19    Cuthriell, Delorise Royals, PA-C  predniSONE (DELTASONE) 50 MG tablet Take 1 tablet (50 mg total) by mouth daily with breakfast. Patient not taking: Reported on 11/17/2019 10/24/19   Cuthriell, Delorise Royals, PA-C    Allergies  Allergen Reactions  . Sulfa Antibiotics Shortness Of Breath    rash     Social History   Socioeconomic History  . Marital status: Single    Spouse name: Not on file  . Number of children: 0  . Years of education: Not on file  . Highest education level: Not on file  Occupational History  . Occupation: Dispensing optician  Tobacco Use  . Smoking status: Former Smoker    Packs/day: 0.10    Years: 1.50    Pack years: 0.15    Types: Cigarettes    Quit date: 04/18/2016    Years since quitting: 3.5  . Smokeless tobacco: Never Used  . Tobacco comment: had stopped smoking 04/18/2016 after 1 yr and restarted 12/2016  Vaping Use  . Vaping Use: Former  . Quit date: 08/18/2016  Substance and Sexual Activity  . Alcohol use: Not Currently    Comment: Ocassional   . Drug use: Not Currently  . Sexual activity: Not Currently    Partners: Male    Birth control/protection: None  Other Topics Concern  . Not on file  Social History Narrative  . Not on file   Social Determinants of Health   Financial Resource Strain:   . Difficulty  of Paying Living Expenses: Not on file  Food Insecurity:   . Worried About Programme researcher, broadcasting/film/video in the Last Year: Not on file  . Ran Out of Food in the Last Year: Not on file  Transportation Needs:   . Lack of Transportation (Medical): Not on file  . Lack of Transportation (Non-Medical): Not on file  Physical Activity:   . Days of Exercise per Week: Not on file  . Minutes of Exercise per Session: Not on file  Stress:   . Feeling of Stress : Not on file  Social Connections:   . Frequency of Communication with Friends and Family: Not on file  . Frequency of Social Gatherings with Friends and Family: Not on file  . Attends Religious  Services: Not on file  . Active Member of Clubs or Organizations: Not on file  . Attends Banker Meetings: Not on file  . Marital Status: Not on file  Intimate Partner Violence:   . Fear of Current or Ex-Partner: Not on file  . Emotionally Abused: Not on file  . Physically Abused: Not on file  . Sexually Abused: Not on file    Family History  Problem Relation Age of Onset  . Thyroid cancer Mother   . Hypertension Mother   . Hyperlipidemia Father   . Anemia Sister   . Heart disease Maternal Grandmother   . Hypertension Maternal Grandmother   . Bladder Cancer Maternal Grandfather 18  . Heart disease Maternal Grandfather   . Hypertension Maternal Grandfather   . Heart disease Paternal Grandfather   . Breast cancer Maternal Aunt 35  . Colon cancer Maternal Uncle 50  . Uterine cancer Other 60    ROS   Physical Exam BP 100/60   Pulse 83   Ht 5\' 2"  (1.575 m)   Wt 134 lb (60.8 kg)   LMP  (LMP Unknown)   Breastfeeding Yes   BMI 24.51 kg/m   OBGyn Exam   Female Chaperone present during breast and/or pelvic exam.  Assessment: 24 y.o. G2P1001 presenting for 6 week postpartum visit  Plan: Problem List Items Addressed This Visit      Other   Postpartum care following vaginal delivery   Encounter for care or examination of lactating mother    Other Visit Diagnoses    Nexplanon insertion           1) Contraception Education given regarding options for contraception, including injectable contraception.   Nexplanon is placed today.  2)  Pap - ASCCP guidelines and rational discussed.  Patient opts for 3 year screening interval  3) Patient underwent screening for postpartum depression with no  concerns noted.  4) Follow up 1 year for routine annual exam     GYNECOLOGY PROCEDURE NOTE  Patient is a 24 y.o. G2P1001 presenting for Nexplanon insertion as her desires means of contraception.  She provided informed consent, signed copy in the chart, time out  was performed. Pregnancy test was negative, with self reported LMP of No LMP recorded (lmp unknown).  She understands that Nexplanon is a progesterone only therapy, and that patients often patients have irregular and unpredictable vaginal bleeding or amenorrhea. She understands that other side effects are possible related to systemic progesterone, including but not limited to, headaches, breast tenderness, nausea, and irritability. While effective at preventing pregnancy long acting reversible contraceptives do not prevent transmission of sexually transmitted diseases and use of barrier methods for this purpose was discussed. The placement procedure for Nexplanon  was reviewed with the patient in detail including risks of nerve injury, infection, bleeding and injury to other muscles or tendons. She understands that the Nexplanon implant is good for 3 years and needs to be removed at the end of that time.  She understands that Nexplanon is an extremely effective option for contraception, with failure rate of <1%. This information is reviewed today and all questions were answered. Informed consent was obtained, both verbally and written.   The patient is healthy and has no contraindications to Implanon use. Urine pregnancy test was performed today and was negative.  Procedure Appropriate time out taken.  Patient placed in dorsal supine with  Her left arm above head, elbow flexed at 90 degrees, arm resting on examination table.  The bicipital grove was palpated and site 8-10cm proximal to the medial epicondyle was indentified . The insertion site was prepped with a two betadine swabs and then injected with 1.5 cc of 1% lidocaine without epinephrine.  Nexplanon removed form sterile blister packaging,  Device confirmed in needle, before inserting full length of needle, tenting up the skin as the needle was advance.  The drug eluting rod was then deployed by pulling back the slider per the manufactures  recommendation.  The implant was palpable by the clinician as well as the patient.  The insertion site covered dressed with a band aid before applying  a kerlex bandage pressure dressing..Minimal blood loss was noted during the procedure.  The patientt tolerated the procedure well.   She was instructed to wear the bandage for 24 hours, call with any signs of infection.  She was given the Implanon card and instructed to have the rod removed in 3 years.  Charge [id number] for nexplanon device, CPT [id number] for procedure J2001 for lidocaine administration Modifer 25, plus Modifer 79 is done during a global billing visit  Mirna Mires, CNM  11/17/2019 3:15 PM   11/17/2019 2:43 PM

## 2020-05-11 ENCOUNTER — Encounter: Payer: Self-pay | Admitting: Family Medicine

## 2020-05-11 ENCOUNTER — Telehealth (INDEPENDENT_AMBULATORY_CARE_PROVIDER_SITE_OTHER): Payer: Medicaid Other | Admitting: Family Medicine

## 2020-05-11 DIAGNOSIS — J040 Acute laryngitis: Secondary | ICD-10-CM

## 2020-05-11 NOTE — Progress Notes (Signed)
MyChart Video Visit    Virtual Visit via Video Note   This visit type was conducted due to national recommendations for restrictions regarding the COVID-19 Pandemic (e.g. social distancing) in an effort to limit this patient's exposure and mitigate transmission in our community. This patient is at least at moderate risk for complications without adequate follow up. This format is felt to be most appropriate for this patient at this time. Physical exam was limited by quality of the video and audio technology used for the visit.    Patient location: home Provider location: [facility] Persons involved in the visit: patient, provider  I discussed the limitations of evaluation and management by telemedicine and the availability of in person appointments. The patient expressed understanding and agreed to proceed.  Patient: Melissa Barr   DOB: 1996-01-22   25 y.o. Female  MRN: [id number] Visit Date: 05/11/2020  Today's healthcare provider: Shirlee Latch, MD   Chief Complaint  Patient presents with  . Laryngitis   Subjective    HPI   Kids are sick with similar symptoms Symptoms started 3/21 Laryngitis starting 3/22 Coughing a lot at nighttime No fever Sore throat + though this is better No rhinorrhea, sinus pain/congestion No SOB or wheezing  Had COVID a few weeks ago   Also wants work note for more work breaks for breastfeeding her 22m old   Social History   Tobacco Use  . Smoking status: Former Smoker    Packs/day: 0.10    Years: 1.50    Pack years: 0.15    Types: Cigarettes    Quit date: 04/18/2016    Years since quitting: 4.0  . Smokeless tobacco: Never Used  . Tobacco comment: had stopped smoking 04/18/2016 after 1 yr and restarted 12/2016  Vaping Use  . Vaping Use: Former  . Quit date: 08/18/2016  Substance Use Topics  . Alcohol use: Not Currently    Comment: Ocassional   . Drug use: Not Currently      Medications: Outpatient  Medications Prior to Visit  Medication Sig  . cetirizine (ZYRTEC) 10 MG tablet Take 1 tablet (10 mg total) by mouth daily.  . hydrOXYzine (VISTARIL) 25 MG capsule Take 1 capsule (25 mg total) by mouth 3 (three) times daily as needed. (Patient not taking: Reported on 11/17/2019)  . predniSONE (DELTASONE) 50 MG tablet Take 1 tablet (50 mg total) by mouth daily with breakfast. (Patient not taking: Reported on 11/17/2019)  . Prenatal Vit-Fe Fumarate-FA (MULTIVITAMIN-PRENATAL) 27-0.8 MG TABS tablet Take 1 tablet by mouth daily at 12 noon.  . triamcinolone cream (KENALOG) 0.1 % Apply 1 application topically 4 (four) times daily.   No facility-administered medications prior to visit.    Review of Systems - per HPI    Objective    There were no vitals taken for this visit.   Physical Exam Constitutional:      General: She is not in acute distress.    Appearance: Normal appearance. She is not diaphoretic.  HENT:     Head: Normocephalic.  Eyes:     Conjunctiva/sclera: Conjunctivae normal.  Pulmonary:     Effort: Pulmonary effort is normal. No respiratory distress.  Neurological:     Mental Status: She is alert and oriented to person, place, and time. Mental status is at baseline.  Psychiatric:        Mood and Affect: Mood normal.        Assessment & Plan     1. Laryngitis -  symptoms and exam c/w viral laryngitis - no evidence of strep pharyngitis, CAP, AOM, bacterial sinusitis, or other bacterial infection - given recent COVID infection, no indication for repeating test - discussed symptomatic management, natural course, and return precautions    She will send message with form for breastfeeding at work through [employer] for completion   Return if symptoms worsen or fail to improve.     I discussed the assessment and treatment plan with the patient. The patient was provided an opportunity to ask questions and all were answered. The patient agreed with the plan and demonstrated  an understanding of the instructions.   The patient was advised to call back or seek an in-person evaluation if the symptoms worsen or if the condition fails to improve as anticipated.  I, Shirlee Latch, MD, have reviewed all documentation for this visit. The documentation on 05/11/20 for the exam, diagnosis, procedures, and orders are all accurate and complete.   Bacigalupo, Marzella Schlein, MD, MPH [facility]

## 2020-05-11 NOTE — Addendum Note (Signed)
Addended by: Erasmo Downer on: 05/11/2020 09:32 AM   Modules accepted: Level of Service

## 2020-05-11 NOTE — Patient Instructions (Signed)
Laryngitis Laryngitis is irritation and swelling (inflammation) of your vocal cords. This condition causes symptoms such as:  A change in your voice. It may sound low and hoarse.  Loss of voice.  Coughing.  Sore throat.  Dry throat.  Stuffy nose. Depending on the cause, this condition may go away after a short time or may last for more than 3 weeks. Treatment often involves resting your voice and using medicines to soothe your throat. Follow these instructions at home: Medicines  Take over-the-counter and prescription medicines only as told by your doctor.  If you were prescribed an antibiotic medicine, take it as told by your doctor. Do not stop taking it even if you start to feel better. General instructions  Talk as little as possible. Also avoid whispering.  Write instead of talking. Do this until your voice is back to normal.  Drink enough fluid to keep your pee (urine) pale yellow.  Breathe in moist air. Use a humidifier if you live in a dry climate.  Do not use any products that have nicotine or tobacco in them, such as cigarettes and e-cigarettes. If you need help quitting, ask your doctor. Contact a doctor if:  You have a fever.  Your pain is worse.  Your symptoms do not get better in 2 weeks. Get help right away if:  You cough up blood.  You have trouble swallowing.  You have trouble breathing. Summary  Laryngitis is inflammation of your vocal cords.  This condition causes your voice to sound low and hoarse.  Rest your voice by talking as little as possible. Also avoid whispering. This information is not intended to replace advice given to you by your health care provider. Make sure you discuss any questions you have with your health care provider. Document Revised: 01/22/2017 Document Reviewed: 01/22/2017 Elsevier Patient Education  2021 Elsevier Inc.  

## 2020-05-15 ENCOUNTER — Telehealth: Payer: Self-pay

## 2020-05-15 NOTE — Telephone Encounter (Signed)
Note sent through mychart

## 2020-05-15 NOTE — Telephone Encounter (Signed)
Copied from CRM [id number]. Topic: General - Inquiry >> May 15, 2020 12:59 PM Daphine Deutscher D wrote: Reason for CRM: Pt sent a message via my chart about breast feeding breaks.  She wants to know if this letter can be sent to her home address.  CB#  [phone]

## 2020-05-19 NOTE — Telephone Encounter (Signed)
Called pt. LM advising forms are completed and ready for pt to pick up. Will scan file copy into pt's chart. TNP-Nichole

## 2020-05-26 ENCOUNTER — Encounter: Payer: Self-pay | Admitting: Family Medicine

## 2020-11-06 ENCOUNTER — Other Ambulatory Visit (HOSPITAL_COMMUNITY)
Admission: RE | Admit: 2020-11-06 | Discharge: 2020-11-06 | Disposition: A | Discharge status: Home / Self Care | Payer: Medicaid Other | Source: Ambulatory Visit | Attending: Obstetrics and Gynecology | Admitting: Obstetrics and Gynecology

## 2020-11-06 ENCOUNTER — Encounter: Payer: Self-pay | Admitting: Obstetrics and Gynecology

## 2020-11-06 ENCOUNTER — Ambulatory Visit: Payer: Medicaid Other | Admitting: Obstetrics and Gynecology

## 2020-11-06 ENCOUNTER — Other Ambulatory Visit: Payer: Self-pay

## 2020-11-06 VITALS — BP 90/60 | Ht 62.0 in | Wt 119.0 lb

## 2020-11-06 DIAGNOSIS — N898 Other specified noninflammatory disorders of vagina: Secondary | ICD-10-CM | POA: Diagnosis not present

## 2020-11-06 DIAGNOSIS — Z113 Encounter for screening for infections with a predominantly sexual mode of transmission: Secondary | ICD-10-CM

## 2020-11-06 LAB — POCT WET PREP WITH KOH
Clue Cells Wet Prep HPF POC: NEGATIVE
KOH Prep POC: NEGATIVE
Trichomonas, UA: NEGATIVE
Yeast Wet Prep HPF POC: NEGATIVE

## 2020-11-06 MED ORDER — FLUCONAZOLE 150 MG PO TABS: 150.0000 mg | ORAL_TABLET | Freq: Once | ORAL | 0 refills | Status: AC

## 2020-11-06 NOTE — Progress Notes (Signed)
Melissa Loveless, PA-C   Chief Complaint  Patient presents with   Vaginal Itching    Irritation, no discharge or odor    HPI:      [name] is a 25 y.o. [phone] whose LMP was No LMP recorded. Patient has had an implant., presents today for vaginal itching and irritation for a couple wks, no increased d/c or odor (although sense of smell isn't as sensitive since covid). Treated with monistat-1 2 days ago without relief. Itching can be severe. No lesions. No prior abx use, no urin sx. Hx of BV and UTIs in past. Not sex active for over a yr. Has nexplanon with irreg bleeding. Uses scented soaps, dryer sheets, wears thongs. 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  Not BF.  Last pap neg 1/21  Past Medical History:  Diagnosis Date   Allergy    Dysmenorrhea    Esophageal reflux    Human papilloma virus (HPV) type 9 vaccine administered    gardisil series complete   Normal labor 09/29/2019   Admitted 8/11 in active labor    Past Surgical History:  Procedure Laterality Date   NO PAST SURGERIES      Family History  Problem Relation Age of Onset   Thyroid cancer Mother    Hypertension Mother    Hyperlipidemia Father    Anemia Sister    Breast cancer Maternal Aunt 35   Colon cancer Maternal Uncle 50   Heart disease Maternal Grandmother    Hypertension Maternal Grandmother    Bladder Cancer Maternal Grandfather 79   Heart disease Maternal Grandfather    Hypertension Maternal Grandfather    Heart disease Paternal Grandfather    Uterine cancer Other 51    Social History   Socioeconomic History   Marital status: Single    Spouse name: Not on file   Number of children: 0   Years of education: Not on file   Highest education level: Not on file  Occupational History   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  Tobacco Use   Smoking status: Former    Packs/day: 0.10    Years: 1.50    Pack years: 0.15    Types: Cigarettes    Quit date: 04/18/2016     Years since quitting: 4.5   Smokeless tobacco: Never   Tobacco comments:    had stopped smoking 04/18/2016 after 1 yr and restarted 12/2016  Vaping Use   Vaping Use: Former   Quit date: 08/18/2016  Substance and Sexual Activity   Alcohol use: Not Currently    Comment: Ocassional    Drug use: Not Currently   Sexual activity: Not Currently    Partners: Male    Birth control/protection: Implant  Other Topics Concern   Not on file  Social History Narrative   Not on file   Social Determinants of Health   Financial Resource Strain: Not on file  Food Insecurity: Not on file  Transportation Needs: Not on file  Physical Activity: Not on file  Stress: Not on file  Social Connections: Not on file  Intimate Partner Violence: Not on file    Outpatient Medications Prior to Visit  Medication Sig Dispense Refill   cetirizine (ZYRTEC) 10 MG tablet Take 1 tablet (10 mg total) by mouth daily. 15 tablet 0   etonogestrel (NEXPLANON) 68 MG IMPL implant 1 each by Subdermal route once.     hydrOXYzine (VISTARIL) 25 MG capsule Take 1 capsule (25 mg total)  by mouth 3 (three) times daily as needed. (Patient not taking: Reported on 11/17/2019) 15 capsule 0   predniSONE (DELTASONE) 50 MG tablet Take 1 tablet (50 mg total) by mouth daily with breakfast. (Patient not taking: Reported on 11/17/2019) 5 tablet 0   Prenatal Vit-Fe Fumarate-FA (MULTIVITAMIN-PRENATAL) 27-0.8 MG TABS tablet Take 1 tablet by mouth daily at 12 noon.     triamcinolone cream (KENALOG) 0.1 % Apply 1 application topically 4 (four) times daily. 30 g 1   No facility-administered medications prior to visit.      ROS:  Review of Systems  Constitutional:  Negative for fever.  Gastrointestinal:  Negative for blood in stool, constipation, diarrhea, nausea and vomiting.  Genitourinary:  Negative for dyspareunia, dysuria, flank pain, frequency, hematuria, urgency, vaginal bleeding, vaginal discharge and vaginal pain.  Musculoskeletal:   Negative for back pain.  Skin:  Negative for rash.  BREAST: No symptoms   OBJECTIVE:   Vitals:  BP 90/60   Ht 5\' 2"  (1.575 m)   Wt 119 lb (54 kg)   Breastfeeding No   BMI 21.77 kg/m   Physical Exam Vitals reviewed.  Constitutional:      Appearance: She is well-developed.  Pulmonary:     Effort: Pulmonary effort is normal.  Genitourinary:    General: Normal vulva.     Pubic Area: No rash.      Labia:        Right: No rash, tenderness or lesion.        Left: No rash, tenderness or lesion.      Vagina: Bleeding present. No vaginal discharge, erythema or tenderness.     Cervix: Normal.     Uterus: Normal. Not enlarged and not tender.      Adnexa: Right adnexa normal and left adnexa normal.       Right: No mass or tenderness.         Left: No mass or tenderness.       Comments: NEG EXT EXAM; NO ERYTHEMA, IRRITATION Musculoskeletal:        General: Normal range of motion.     Cervical back: Normal range of motion.  Skin:    General: Skin is warm and dry.  Neurological:     General: No focal deficit present.     Mental Status: She is alert and oriented to person, place, and time.  Psychiatric:        Mood and Affect: Mood normal.        Behavior: Behavior normal.        Thought Content: Thought content normal.        Judgment: Judgment normal.    Results: Results for orders placed or performed in visit on 11/06/20 (from the past 24 hour(s))  POCT Wet Prep with KOH     Status: Normal   Collection Time: 11/06/20 11:29 AM  Result Value Ref Range   Trichomonas, UA Negative    Clue Cells Wet Prep HPF POC neg    Epithelial Wet Prep HPF POC     Yeast Wet Prep HPF POC neg    Bacteria Wet Prep HPF POC     RBC Wet Prep HPF POC     WBC Wet Prep HPF POC     KOH Prep POC Negative Negative     Assessment/Plan: Vaginal itching - Plan: Cervicovaginal ancillary only, fluconazole (DIFLUCAN) 150 MG tablet, POCT Wet Prep with KOH; neg exam and wet prep, pos sx. Question chem vs  fungal vs itch/scratch.  Treat empirically with diflucan. Check STDs/vag culture. Will f/u with results if pos. If neg, then most likely chem. Dove sens skin soap, line dry underwear, avoid thongs. OTC hydrocortisone crm/cool compresses prn itch. F/u prn.   Screening for STD (sexually transmitted disease) - Plan: Cervicovaginal ancillary only    Meds ordered this encounter  Medications   fluconazole (DIFLUCAN) 150 MG tablet    Sig: Take 1 tablet (150 mg total) by mouth once for 1 dose.    Dispense:  1 tablet    Refill:  0    Order Specific Question:   Supervising Provider    Answer:   Nadara Mustard [[id number]]      Return if symptoms worsen or fail to improve.  Stryder Poitra B. Emmalena Canny, PA-C 11/06/2020 11:31 AM

## 2020-11-06 NOTE — Patient Instructions (Signed)
I value your feedback and you entrusting us with your care. If you get a [facility] patient survey, I would appreciate you taking the time to let us know about your experience today. Thank you! ? ? ?

## 2020-11-07 LAB — CERVICOVAGINAL ANCILLARY ONLY
Bacterial Vaginitis (gardnerella): NEGATIVE
Candida Glabrata: NEGATIVE
Candida Vaginitis: NEGATIVE
Chlamydia: NEGATIVE
Comment: NEGATIVE
Comment: NEGATIVE
Comment: NEGATIVE
Comment: NEGATIVE
Comment: NORMAL
Neisseria Gonorrhea: NEGATIVE

## 2020-12-12 NOTE — Telephone Encounter (Signed)
Nexplanon rcvd/charged 11/17/2019

## 2020-12-28 ENCOUNTER — Encounter: Payer: Self-pay | Admitting: Physician Assistant

## 2020-12-28 ENCOUNTER — Telehealth: Payer: Medicaid Other | Admitting: Physician Assistant

## 2020-12-28 ENCOUNTER — Ambulatory Visit: Payer: Self-pay

## 2020-12-28 DIAGNOSIS — J029 Acute pharyngitis, unspecified: Secondary | ICD-10-CM

## 2020-12-28 MED ORDER — AMOXICILLIN 500 MG PO TABS: 500.0000 mg | ORAL_TABLET | Freq: Two times a day (BID) | ORAL | 0 refills | Status: AC

## 2020-12-28 NOTE — Progress Notes (Signed)
MyChart Video Visit    Virtual Visit via Video Note   This visit type was conducted due to national recommendations for restrictions regarding the COVID-19 Pandemic (e.g. social distancing) in an effort to limit this patient's exposure and mitigate transmission in our community. This patient is at least at moderate risk for complications without adequate follow up. This format is felt to be most appropriate for this patient at this time. Physical exam was limited by quality of the video and audio technology used for the visit.   Patient location: home Provider location: [facility]  I discussed the limitations of evaluation and management by telemedicine and the availability of in person appointments. The patient expressed understanding and agreed to proceed.  Patient: Melissa Barr   DOB: Oct 07, 1995   25 y.o. Female  MRN: [id number] Visit Date: 12/28/2020  Today's healthcare provider: Alfredia Ferguson, PA-C   Chief Complaint  Patient presents with   Sore Throat   Cough   Generalized Body Aches   Chills   Subjective    HPI  Melissa Barr is a 25 y/o female who presents today with sore throat x 2 weeks. She states when she looks at the back of her throat she sees small red bumps. Pain with swallowing, feels knot at back of her throat. Over the last two weeks she has also had chills and body aches but no recorded fever. Her two young children are currently being treated with antibiotics for ear infections. She has been taking ibuprofen for pain relief, mainly at night. Symptoms have not improved. Denies SOB, wheezing, CP.  Medications: Outpatient Medications Prior to Visit  Medication Sig   cetirizine (ZYRTEC) 10 MG tablet Take 1 tablet (10 mg total) by mouth daily.   etonogestrel (NEXPLANON) 68 MG IMPL implant 1 each by Subdermal route once.   No facility-administered medications prior to visit.    Review of Systems  Constitutional:  Positive for chills and fatigue.  HENT:  Positive  for mouth sores, sore throat and trouble swallowing.   Respiratory:  Positive for cough. Negative for shortness of breath and wheezing.   Cardiovascular:  Negative for chest pain.  Musculoskeletal:  Positive for myalgias.  All other systems reviewed and are negative.   Objective    There were no vitals taken for this visit.   Physical Exam Constitutional:      General: She is not in acute distress. Pulmonary:     Effort: Pulmonary effort is normal.  Neurological:     General: No focal deficit present.     Mental Status: She is alert and oriented to person, place, and time.  Psychiatric:        Mood and Affect: Mood normal.        Behavior: Behavior normal.       Assessment & Plan     URI infection, suspicion for strep > 2 weeks, children on antibiotics Due to childcare and financial constraints difficult for pt to get rapid strep/covid testing Encouraged pt to see if she can get covid tests from pharm covered by her insurance Rx amoxicillin 500 mg BID x 10 days She can take tylenol/ibuprofen as need for pain relief  Return if symptoms worsen or fail to improve.     I discussed the assessment and treatment plan with the patient. The patient was provided an opportunity to ask questions and all were answered. The patient agreed with the plan and demonstrated an understanding of the instructions.   The patient  was advised to call back or seek an in-person evaluation if the symptoms worsen or if the condition fails to improve as anticipated.  I provided 15 minutes of non-face-to-face time during this encounter.  I, Alfredia Ferguson, PA-C have reviewed all documentation for this visit. The documentation on  12/28/2020 or the exam, diagnosis, procedures, and orders are all accurate and complete.   Alfredia Ferguson, PA-C [facility] [phone] (phone) [phone] (fax)  [facility]

## 2020-12-28 NOTE — Telephone Encounter (Signed)
Pt. States she started feeling bad last Monday - cough, achy,chills. Still has a cough and "really bad sore throat." Reports her children are on antibiotics for ear infections. Warm transfer to [place] for virtual appointment.    Reason for Disposition  [1] Continuous (nonstop) coughing interferes with work or school AND [2] no improvement using cough treatment per Care Advice  Answer Assessment - Initial Assessment Questions 1. ONSET: "When did the cough begin?"      Last Monday 2. SEVERITY: "How bad is the cough today?"      Severe 3. SPUTUM: "Describe the color of your sputum" (none, dry cough; clear, white, yellow, green)     None 4. HEMOPTYSIS: "Are you coughing up any blood?" If so ask: "How much?" (flecks, streaks, tablespoons, etc.)     No 5. DIFFICULTY BREATHING: "Are you having difficulty breathing?" If Yes, ask: "How bad is it?" (e.g., mild, moderate, severe)    - MILD: No SOB at rest, mild SOB with walking, speaks normally in sentences, can lie down, no retractions, pulse < 100.    - MODERATE: SOB at rest, SOB with minimal exertion and prefers to sit, cannot lie down flat, speaks in phrases, mild retractions, audible wheezing, pulse 100-120.    - SEVERE: Very SOB at rest, speaks in single words, struggling to breathe, sitting hunched forward, retractions, pulse > 120      Mild 6. FEVER: "Do you have a fever?" If Yes, ask: "What is your temperature, how was it measured, and when did it start?"     No 7. CARDIAC HISTORY: "Do you have any history of heart disease?" (e.g., heart attack, congestive heart failure)      No 8. LUNG HISTORY: "Do you have any history of lung disease?"  (e.g., pulmonary embolus, asthma, emphysema)     No 9. PE RISK FACTORS: "Do you have a history of blood clots?" (or: recent major surgery, recent prolonged travel, bedridden)     No 10. OTHER SYMPTOMS: "Do you have any other symptoms?" (e.g., runny nose, wheezing, chest pain)       Sore throat 11.  PREGNANCY: "Is there any chance you are pregnant?" "When was your last menstrual period?"       No 12. TRAVEL: "Have you traveled out of the country in the last month?" (e.g., travel history, exposures)       No  Protocols used: Cough - Acute Non-Productive-A-AH

## 2021-04-11 ENCOUNTER — Ambulatory Visit (INDEPENDENT_AMBULATORY_CARE_PROVIDER_SITE_OTHER): Payer: Medicaid Other | Admitting: Physician Assistant

## 2021-04-11 ENCOUNTER — Encounter: Payer: Self-pay | Admitting: Physician Assistant

## 2021-04-11 ENCOUNTER — Other Ambulatory Visit: Payer: Self-pay

## 2021-04-11 VITALS — BP 108/63 | HR 81 | Ht 62.0 in | Wt 124.3 lb

## 2021-04-11 DIAGNOSIS — M26623 Arthralgia of bilateral temporomandibular joint: Secondary | ICD-10-CM | POA: Diagnosis not present

## 2021-04-11 DIAGNOSIS — H6983 Other specified disorders of Eustachian tube, bilateral: Secondary | ICD-10-CM | POA: Diagnosis not present

## 2021-04-11 MED ORDER — AZELASTINE HCL 0.1 % NA SOLN: 2.0000 | Freq: Every day | NASAL | 3 refills | Status: DC

## 2021-04-11 NOTE — Progress Notes (Signed)
Established patient visit   Patient: Melissa Barr   DOB: 1995-10-13   25 y.o. Female  MRN: [id number] Visit Date: 04/11/2021  Today's healthcare provider: Mikey Kirschner, PA-C   Cc. Jaw pain  Subjective    HPI  Melissa Barr is a 26 y/o female who presents today with concerns over bilateral jaw pain/popping. She states it has happened on an off since 2020 but has been more annoying. Feeling like she needs to pop her jaw or it is too tight. Reports her dentist told her it was probably TMJ and that she grinds her teeth. Had recommended a mouth guard. Denies significant pain, facial numbness, swelling.   She also reports frequent ringing b/l ears, unsure if related. Denies nasal congestion but admits to seasonal allergies. Takes Zyrtec occasionally.  Medications: Outpatient Medications Prior to Visit  Medication Sig   cetirizine (ZYRTEC) 10 MG tablet Take 1 tablet (10 mg total) by mouth daily.   etonogestrel (NEXPLANON) 68 MG IMPL implant 1 each by Subdermal route once.   No facility-administered medications prior to visit.    Review of Systems  Constitutional:  Negative for fatigue and fever.  HENT:  Positive for tinnitus.        Jaw popping and pain  Respiratory:  Negative for cough and shortness of breath.   Cardiovascular:  Negative for chest pain and leg swelling.  Gastrointestinal:  Negative for abdominal pain.  Neurological:  Negative for dizziness and headaches.     Objective    BP 108/63 (BP Location: Right Arm, Patient Position: Sitting, Cuff Size: Normal)    Pulse 81    Ht 5\' 2"  (1.575 m)    Wt 124 lb 4.8 oz (56.4 kg)    SpO2 99%    BMI 22.73 kg/m   Physical Exam Constitutional:      General: She is awake.     Appearance: She is well-developed.  HENT:     Head: Normocephalic.     Jaw: There is normal jaw occlusion. No tenderness or swelling.     Comments: Left side of jaw with less mobility than the right. Popping sensation felt bilaterally.     Ears:      Comments: Clear effusion b/l TM Eyes:     Conjunctiva/sclera: Conjunctivae normal.  Cardiovascular:     Rate and Rhythm: Normal rate and regular rhythm.     Heart sounds: Normal heart sounds.  Pulmonary:     Effort: Pulmonary effort is normal.     Breath sounds: Normal breath sounds.  Skin:    General: Skin is warm.  Neurological:     Mental Status: She is alert and oriented to person, place, and time.  Psychiatric:        Attention and Perception: Attention normal.        Mood and Affect: Mood normal.        Speech: Speech normal.        Behavior: Behavior is cooperative.     No results found for any visits on 04/11/21.  Assessment & Plan     Problem List Items Addressed This Visit       Nervous and Auditory   Dysfunction of both eustachian tubes    May be etiology of ringing, advised azelastine nasal spray daily, and to take zyrtec consistently      Relevant Medications   azelastine (ASTELIN) 0.1 % nasal spray     Other   Bilateral temporomandibular joint pain - Primary  Advised she call her dentist to see if they treat TMJ or if they know a local dentist who does Advised ice, stop chewing gum, jaw exercises, massage. Potential of physical therapy in the future if she fails treatment with dentist         Return in about 4 months (around 08/09/2021) for CPE.      I, Mikey Kirschner, PA-C have reviewed all documentation for this visit. The documentation on  04/11/2021  for the exam, diagnosis, procedures, and orders are all accurate and complete.    Mikey Kirschner, PA-C [facility] [address] [place], [place], [address] Office: [phone] Fax: [place]

## 2021-04-11 NOTE — Assessment & Plan Note (Signed)
Advised she call her dentist to see if they treat TMJ or if they know a local dentist who does Advised ice, stop chewing gum, jaw exercises, massage. Potential of physical therapy in the future if she fails treatment with dentist

## 2021-04-11 NOTE — Assessment & Plan Note (Signed)
May be etiology of ringing, advised azelastine nasal spray daily, and to take zyrtec consistently

## 2021-05-10 ENCOUNTER — Other Ambulatory Visit: Payer: Self-pay

## 2021-05-10 ENCOUNTER — Encounter: Payer: Self-pay | Admitting: Physician Assistant

## 2021-05-10 ENCOUNTER — Ambulatory Visit: Payer: Medicaid Other | Admitting: Physician Assistant

## 2021-05-10 VITALS — BP 119/75 | HR 95 | Ht 62.0 in | Wt 125.5 lb

## 2021-05-10 DIAGNOSIS — J029 Acute pharyngitis, unspecified: Secondary | ICD-10-CM | POA: Diagnosis not present

## 2021-05-10 LAB — POCT RAPID STREP A (OFFICE): Rapid Strep A Screen: NEGATIVE

## 2021-05-10 NOTE — Progress Notes (Signed)
? ?I,Melissa Barr,acting as a Neurosurgeon for [employer], PA-C.,have documented all relevant documentation on the behalf of Melissa Ferguson, PA-C,as directed by  Melissa Ferguson, PA-C while in the presence of Melissa Ferguson, PA-C. ? ?Acute Office Visit ? ?Subjective:  ? ? Patient ID: Melissa Barr, female    DOB: 02-Mar-1995, 26 y.o.   MRN: [id number] ? ?Cc. Sore throat, hoarseness. ? ?[name] reports feeling a sore throat, hoarse voice x 2-3 days. Denies other URI symptoms, denies fevers, chills. Wants to make sure it isn't strep for work. She has been out of work for two days.  ? ? ? ?Past Medical History:  ?Diagnosis Date  ? Allergy   ? Dysmenorrhea   ? Esophageal reflux   ? Human papilloma virus (HPV) type 9 vaccine administered   ? gardisil series complete  ? Normal labor 09/29/2019  ? Admitted 8/11 in active labor  ? ? ?Past Surgical History:  ?Procedure Laterality Date  ? NO PAST SURGERIES    ? ? ?Family History  ?Problem Relation Age of Onset  ? Thyroid cancer Mother   ? Hypertension Mother   ? Hyperlipidemia Father   ? Anemia Sister   ? Breast cancer Maternal Aunt 35  ? Colon cancer Maternal Uncle 50  ? Heart disease Maternal Grandmother   ? Hypertension Maternal Grandmother   ? Bladder Cancer Maternal Grandfather 2  ? Heart disease Maternal Grandfather   ? Hypertension Maternal Grandfather   ? Heart disease Paternal Grandfather   ? Uterine cancer Other 60  ? ? ?Social History  ? ?Socioeconomic History  ? Marital status: Single  ?  Spouse name: Not on file  ? Number of children: 0  ? Years of education: Not on file  ? Highest education level: Not on file  ?Occupational History  ? Occupation: Dispensing optician  ?Tobacco Use  ? Smoking status: Former  ?  Packs/day: 0.10  ?  Years: 1.50  ?  Pack years: 0.15  ?  Types: Cigarettes  ?  Quit date: 04/18/2016  ?  Years since quitting: 5.0  ? Smokeless tobacco: Never  ? Tobacco comments:  ?  had stopped smoking 04/18/2016 after 1 yr and restarted 12/2016   ?Vaping Use  ? Vaping Use: Every day  ? Last attempt to quit: 08/18/2016  ?Substance and Sexual Activity  ? Alcohol use: Not Currently  ?  Comment: Ocassional   ? Drug use: Not Currently  ? Sexual activity: Not Currently  ?  Partners: Male  ?  Birth control/protection: Implant  ?Other Topics Concern  ? Not on file  ?Social History Narrative  ? Not on file  ? ?Social Determinants of Health  ? ?Financial Resource Strain: Not on file  ?Food Insecurity: Not on file  ?Transportation Needs: Not on file  ?Physical Activity: Not on file  ?Stress: Not on file  ?Social Connections: Not on file  ?Intimate Partner Violence: Not on file  ? ? ?Outpatient Medications Prior to Visit  ?Medication Sig Dispense Refill  ? azelastine (ASTELIN) 0.1 % nasal spray Place 2 sprays into both nostrils daily. Use in each nostril as directed 30 mL 3  ? cetirizine (ZYRTEC) 10 MG tablet Take 1 tablet (10 mg total) by mouth daily. 15 tablet 0  ? etonogestrel (NEXPLANON) 68 MG IMPL implant 1 each by Subdermal route once.    ? ?No facility-administered medications prior to visit.  ? ? ?Allergies  ?Allergen Reactions  ? Sulfa Antibiotics Shortness Of Breath  ?  rash  ? ? ?Review of Systems  ?Constitutional:  Negative for fatigue and fever.  ?HENT:  Positive for sore throat and voice change.   ?Respiratory:  Negative for cough and shortness of breath.   ?Cardiovascular:  Negative for chest pain and leg swelling.  ?Gastrointestinal:  Negative for abdominal pain.  ?Neurological:  Negative for dizziness and headaches.  ? ?   ?Objective:  ?  ?Physical Exam ?Constitutional:   ?   General: She is awake.  ?   Appearance: She is well-developed.  ?HENT:  ?   Head: Normocephalic.  ?   Mouth/Throat:  ?   Pharynx: Posterior oropharyngeal erythema present. No oropharyngeal exudate.  ?Eyes:  ?   Conjunctiva/sclera: Conjunctivae normal.  ?Cardiovascular:  ?   Rate and Rhythm: Normal rate and regular rhythm.  ?   Heart sounds: Normal heart sounds.  ?Pulmonary:  ?    Effort: Pulmonary effort is normal.  ?   Breath sounds: Normal breath sounds.  ?Skin: ?   General: Skin is warm.  ?Neurological:  ?   Mental Status: She is alert and oriented to person, place, and time.  ?Psychiatric:     ?   Attention and Perception: Attention normal.     ?   Mood and Affect: Mood normal.     ?   Speech: Speech normal.     ?   Behavior: Behavior is cooperative.  ? ? ?There were no vitals taken for this visit. ?Wt Readings from Last 3 Encounters:  ?04/11/21 124 lb 4.8 oz (56.4 kg)  ?11/06/20 119 lb (54 kg)  ?11/17/19 134 lb (60.8 kg)  ? ? ?Health Maintenance Due  ?Topic Date Due  ? COVID-19 Vaccine (1) Never done  ? Hepatitis C Screening  Never done  ? ? ?There are no preventive care reminders to display for this patient. ? ? ?No results found for: TSH ?Lab Results  ?Component Value Date  ? WBC 16.8 (H) 09/30/2019  ? HGB 10.8 (L) 09/30/2019  ? HCT 31.5 (L) 09/30/2019  ? MCV 92.1 09/30/2019  ? PLT 200 09/30/2019  ? ?Lab Results  ?Component Value Date  ? NA 135 06/21/2014  ? K 3.9 06/21/2014  ? CO2 21 (L) 06/21/2014  ? GLUCOSE 112 (H) 06/21/2014  ? BUN 20 06/21/2014  ? CREATININE 0.77 06/21/2014  ? BILITOT 0.6 06/21/2014  ? ALKPHOS 40 (L) 06/21/2014  ? AST 18 06/21/2014  ? ALT 10 (L) 06/21/2014  ? PROT 7.0 06/21/2014  ? ALBUMIN 4.0 06/21/2014  ? CALCIUM 8.8 (L) 06/21/2014  ? ANIONGAP 8 06/21/2014  ? ?No results found for: CHOL ?No results found for: HDL ?No results found for: LDLCALC ?No results found for: TRIG ?No results found for: CHOLHDL ?No results found for: HGBA1C ? ?   ?Assessment & Plan:  ? ?Acute pharyngitis ?Strep poc negative ?Advised tylenol, fluids. Can continue with OTC antihistamines ? ?I, Melissa Ferguson, PA-C have reviewed all documentation for this visit. The documentation on  05/10/2021 for the exam, diagnosis, procedures, and orders are all accurate and complete. ? ?Melissa Ferguson, PA-C ?[facility] ?[address] ?[place], [place], [address] ?Office:  [phone] ?Fax: [phone]  ? ?

## 2021-07-13 ENCOUNTER — Other Ambulatory Visit: Payer: Self-pay | Admitting: Physician Assistant

## 2021-07-13 ENCOUNTER — Ambulatory Visit: Payer: Medicaid Other | Admitting: Physician Assistant

## 2021-07-13 ENCOUNTER — Encounter: Payer: Self-pay | Admitting: Physician Assistant

## 2021-07-13 ENCOUNTER — Ambulatory Visit: Payer: Self-pay | Admitting: *Deleted

## 2021-07-13 VITALS — BP 106/72 | HR 95 | Temp 98.4°F | Resp 16 | Wt 127.0 lb

## 2021-07-13 DIAGNOSIS — B308 Other viral conjunctivitis: Secondary | ICD-10-CM | POA: Diagnosis not present

## 2021-07-13 DIAGNOSIS — H9201 Otalgia, right ear: Secondary | ICD-10-CM | POA: Diagnosis not present

## 2021-07-13 DIAGNOSIS — J029 Acute pharyngitis, unspecified: Secondary | ICD-10-CM | POA: Diagnosis not present

## 2021-07-13 LAB — POCT INFLUENZA A/B
Influenza A, POC: NEGATIVE
Influenza B, POC: NEGATIVE

## 2021-07-13 LAB — POCT RAPID STREP A (OFFICE): Rapid Strep A Screen: NEGATIVE

## 2021-07-13 MED ORDER — ACETIC ACID 2 % OT SOLN: 4.0000 [drp] | Freq: Three times a day (TID) | OTIC | 0 refills | Status: DC

## 2021-07-13 MED ORDER — POLYMYXIN B-TRIMETHOPRIM 10000-0.1 UNIT/ML-% OP SOLN: 1.0000 [drp] | Freq: Four times a day (QID) | OPHTHALMIC | 0 refills | Status: DC

## 2021-07-13 NOTE — Progress Notes (Signed)
I,Ramsie Ostrander Robinson,acting as a Education administrator for [employer], PA-C.,have documented all relevant documentation on the behalf of Mardene Speak, PA-C,as directed by  [employer], PA-C while in the presence of [employer], PA-C.  Established patient visit   Patient: Melissa Barr   DOB: 1995/04/12   25 y.o. Female  MRN: [id number] Visit Date: 07/13/2021  Today's healthcare provider: Mardene Speak, PA-C   Chief Complaint  Patient presents with   Eye Problem   "possible pink eye "  Subjective     Patient is a 26 yr old female presenting for possible pink eye.  Reports left eye is red, sore, watery with green pus with a little blurriness and chills. Onset yesterday. Reports pink eye is going around at her job. Fever last night of 100.2. Endorses having mild ear pain, headache, nasal congestion, runny nose, cough, sore throat. Has a hx of covid-19 infection, flu.  Medications: Outpatient Medications Prior to Visit  Medication Sig   azelastine (ASTELIN) 0.1 % nasal spray Place 2 sprays into both nostrils daily. Use in each nostril as directed   cetirizine (ZYRTEC) 10 MG tablet Take 1 tablet (10 mg total) by mouth daily.   etonogestrel (NEXPLANON) 68 MG IMPL implant 1 each by Subdermal route once.   No facility-administered medications prior to visit.    Review of Systems  Constitutional:  Positive for fever.  HENT:  Positive for congestion, ear pain, rhinorrhea, sore throat, trouble swallowing and voice change. Negative for ear discharge.   Eyes:  Positive for pain, discharge and redness.  Respiratory:  Positive for cough.   Cardiovascular: Negative.   Gastrointestinal: Negative.       Objective    BP 106/72 (BP Location: Right Arm, Patient Position: Sitting, Cuff Size: Normal)   Pulse 95   Temp 98.4 F (36.9 C) (Oral)   Resp 16   Wt 127 lb (57.6 kg)   SpO2 100%   BMI 23.23 kg/m    Physical Exam Vitals reviewed.  Constitutional:      General: She is not in acute  distress.    Appearance: Normal appearance. She is normal weight. She is ill-appearing.  HENT:     Head: Normocephalic and atraumatic.     Left Ear: Tympanic membrane, ear canal and external ear normal.     Ears:     Comments: Injected R TM Negative tragus test    Nose: Congestion and rhinorrhea present.     Mouth/Throat:     Pharynx: Oropharyngeal exudate and posterior oropharyngeal erythema present.  Eyes:     General:        Right eye: No discharge.        Left eye: Discharge present.    Comments: Injected L conjunctiva  Cardiovascular:     Rate and Rhythm: Normal rate and regular rhythm.     Pulses: Normal pulses.     Heart sounds: Normal heart sounds.  Pulmonary:     Effort: Pulmonary effort is normal.     Breath sounds: Normal breath sounds.  Musculoskeletal:     Cervical back: Normal range of motion. Rigidity present.  Lymphadenopathy:     Cervical: Cervical adenopathy present.  Neurological:     Mental Status: She is alert.     No results found for any visits on 07/13/21.  Assessment & Plan     1. Sore throat Could be due to acute tonsillitis/pharyngitis vs URI Normal Vitals, Normal lung PE - POCT rapid strep A  neg - POCT Influenza A/B neg  2. Chronic viral conjunctivitis of left eye OTC decongestant and antihistamines, abx if symptoms worsen  3. Right ear pain Acetic acid 3 times daily  Work note was provided  The patient was advised to call back or seek an in-person evaluation if the symptoms worsen or if the condition fails to improve as anticipated.     I discussed the assessment and treatment plan with the patient. The patient was provided an opportunity to ask questions and all were answered. The patient agreed with the plan and demonstrated an understanding of the instructions.  The entirety of the information documented in the History of Present Illness, Review of Systems and Physical Exam were personally obtained by me. Portions of this  information were initially documented by the CMA and reviewed by me for thoroughness and accuracy.  Portions of this note were created using dictation software and may contain typographical errors.     Mardene Speak, PA-C  [facility] [phone] (phone) [phone] (fax)  [place]

## 2021-07-13 NOTE — Telephone Encounter (Signed)
  Chief Complaint: sore, watery eye, green pus and fever.   Pink eye going around at her job Symptoms: above Frequency: since yesterday Pertinent Negatives: Patient denies burning of eye Disposition: [] ED /[] Urgent Care (no appt availability in office) / [] Appointment(In office/virtual)/ []  [facility]/ [] Home Care/ [] Refused Recommended Disposition /[] [place] [facility]/ []  Follow-up with PCP Additional Notes: Appt made for today with Mardene Speak, PA-C at 3:40

## 2021-07-13 NOTE — Progress Notes (Deleted)
      Established patient visit   Patient: Melissa Barr   DOB: [birth year]   25 y.o. Female  MRN: [id number] Visit Date: 07/13/2021  Today's healthcare provider: Debera Lat, PA-C   No chief complaint on file. CC: eye problem  Subjective               Medications: Outpatient Medications Prior to Visit  Medication Sig   azelastine (ASTELIN) 0.1 % nasal spray Place 2 sprays into both nostrils daily. Use in each nostril as directed   cetirizine (ZYRTEC) 10 MG tablet Take 1 tablet (10 mg total) by mouth daily.   etonogestrel (NEXPLANON) 68 MG IMPL implant 1 each by Subdermal route once.   No facility-administered medications prior to visit.    Review of Systems  {Labs  Heme  Chem  Endocrine  Serology  Results Review (optional):23779}   Objective    There were no vitals taken for this visit. {Show previous vital signs (optional):23777}  Physical Exam  ***  No results found for any visits on 07/13/21.  Assessment & Plan     ***  No follow-ups on file.      {provider attestation***:1}   Debera Lat, Cordelia Poche  [facility] [phone] (phone) [phone] (fax)  [facility]

## 2021-07-13 NOTE — Telephone Encounter (Signed)
Reason for Disposition  Yellow or green pus occurs  Answer Assessment - Initial Assessment Questions 1. ONSET: "When did the pain start?" (e.g., minutes, hours, days)     I have a fever and my left eye is itching and watery.   Pink eye is going around my work.   Fever started last night.   100.2   When I woke up there is green drainage.    2. TIMING: "Does the pain come and go, or has it been constant since it started?" (e.g., constant, intermittent, fleeting)     It hurts but no burning.   3. SEVERITY: "How bad is the pain?"   (Scale 1-10; mild, moderate or severe)   - MILD (1-3): doesn't interfere with normal activities    - MODERATE (4-7): interferes with normal activities or awakens from sleep    - SEVERE (8-10): excruciating pain and patient unable to do normal activities     Not asked 4. LOCATION: "Where does it hurt?"  (e.g., eyelid, eye, cheekbone)     Left eye 5. CAUSE: "What do you think is causing the pain?"     Pink eye 6. VISION: "Do you have blurred vision or changes in your vision?"      A little blurry   7. EYE DISCHARGE: "Is there any discharge (pus) from the eye(s)?"  If yes, ask: "What color is it?"      Green discharge 8. FEVER: "Do you have a fever?" If Yes, ask: "What is it, how was it measured, and when did it start?"      100.2 9. OTHER SYMPTOMS: "Do you have any other symptoms?" (e.g., headache, nasal discharge, facial rash)     Sore throat.    10. PREGNANCY: "Is there any chance you are pregnant?" "When was your last menstrual period?"       No  Protocols used: Eye Pain and Other Symptoms-A-AH

## 2021-07-17 ENCOUNTER — Other Ambulatory Visit: Payer: Self-pay | Admitting: Physician Assistant

## 2021-07-17 DIAGNOSIS — H10022 Other mucopurulent conjunctivitis, left eye: Secondary | ICD-10-CM

## 2021-07-17 MED ORDER — OFLOXACIN 0.3 % OP SOLN: 1.0000 [drp] | Freq: Four times a day (QID) | OPHTHALMIC | 0 refills | Status: DC

## 2021-07-17 NOTE — Progress Notes (Unsigned)
New eye drops were sent to pharmacy. Please, let pt know

## 2021-07-19 ENCOUNTER — Telehealth: Payer: Self-pay | Admitting: Physician Assistant

## 2021-07-19 NOTE — Telephone Encounter (Signed)
Left a message about Rx

## 2021-07-30 NOTE — Telephone Encounter (Signed)
LMTRC

## 2021-08-08 ENCOUNTER — Ambulatory Visit: Payer: Medicaid Other | Admitting: Physician Assistant

## 2021-08-08 ENCOUNTER — Encounter: Payer: Self-pay | Admitting: Physician Assistant

## 2021-08-08 VITALS — BP 97/65 | HR 102 | Temp 98.9°F | Resp 16 | Wt 126.3 lb

## 2021-08-08 DIAGNOSIS — J029 Acute pharyngitis, unspecified: Secondary | ICD-10-CM

## 2021-08-08 LAB — POCT RAPID STREP A (OFFICE): Rapid Strep A Screen: NEGATIVE

## 2021-08-08 MED ORDER — AMOXICILLIN 500 MG PO TABS: 500.0000 mg | ORAL_TABLET | Freq: Two times a day (BID) | ORAL | 0 refills | Status: DC

## 2021-08-08 NOTE — Progress Notes (Signed)
I,Erik Burkett Robinson,acting as a Neurosurgeon for [employer], PA-C.,have documented all relevant documentation on the behalf of Debera Lat, PA-C,as directed by  [employer], PA-C while in the presence of [employer], PA-C.   Established patient visit   Patient: Melissa Barr   DOB: [birth year]   26 y.o. Female  MRN: [id number] Visit Date: 08/08/2021  Today's healthcare provider: Debera Lat, PA-C   Chief Complaint  Patient presents with   Sore Throat  CC: sore throat / body aches  Subjective     Melissa Barr is a 26 yr old female presenting for sore throat and body aches, headache, neck stiffness, chills, fever 102.6 last night. Seen for similar symptoms on 07-13-21. Just wants to sleep.  Seen on 07-13-21 for similar symptoms and states she got better then symptoms returned on Monday.   Reports son is sick at home also.  Two children in daycare. Taking Ibuprofen with mild relief.     Medications: Outpatient Medications Prior to Visit  Medication Sig   azelastine (ASTELIN) 0.1 % nasal spray Place 2 sprays into both nostrils daily. Use in each nostril as directed   etonogestrel (NEXPLANON) 68 MG IMPL implant 1 each by Subdermal route once.   acetic acid 2 % otic solution Place 4 drops into both ears 3 (three) times daily. (Patient not taking: Reported on 08/08/2021)   cetirizine (ZYRTEC) 10 MG tablet Take 1 tablet (10 mg total) by mouth daily.   ofloxacin (OCUFLOX) 0.3 % ophthalmic solution Place 1 drop into both eyes 4 (four) times daily. (Patient not taking: Reported on 08/08/2021)   No facility-administered medications prior to visit.    Review of Systems     Objective    BP 97/65 (BP Location: Left Arm, Patient Position: Sitting, Cuff Size: Normal)   Pulse (!) 102   Temp 98.9 F (37.2 C) (Oral)   Resp 16   Wt 126 lb 4.8 oz (57.3 kg)   SpO2 100%   BMI 23.10 kg/m    Physical Exam Vitals reviewed.  Constitutional:      General: She is in acute distress.      Appearance: Normal appearance. She is well-developed and normal weight. She is ill-appearing. She is not diaphoretic.  HENT:     Head: Normocephalic and atraumatic.     Right Ear: Ear canal normal. No drainage, swelling or tenderness. No middle ear effusion.     Left Ear: Tympanic membrane and ear canal normal. No drainage, swelling or tenderness.  No middle ear effusion. Tympanic membrane is not erythematous.     Nose: Rhinorrhea present. No congestion.     Mouth/Throat:     Mouth: No oral lesions.     Pharynx: Uvula midline. Oropharyngeal exudate and posterior oropharyngeal erythema present. No pharyngeal swelling or uvula swelling.     Tonsils: 2+ on the right. 2+ on the left.  Eyes:     General: No scleral icterus.    Conjunctiva/sclera: Conjunctivae normal.     Pupils: Pupils are equal, round, and reactive to light.  Neck:     Thyroid: No thyromegaly.  Cardiovascular:     Rate and Rhythm: Normal rate and regular rhythm.     Pulses: Normal pulses.     Heart sounds: Normal heart sounds. No murmur heard. Pulmonary:     Effort: Pulmonary effort is normal. No respiratory distress.     Breath sounds: Normal breath sounds. No wheezing, rhonchi or rales.  Abdominal:  General: Bowel sounds are normal.     Palpations: Abdomen is soft.  Musculoskeletal:     Cervical back: Normal range of motion and neck supple.     Right lower leg: No edema.     Left lower leg: No edema.  Skin:    General: Skin is warm and dry.     Findings: No rash.  Neurological:     General: No focal deficit present.     Mental Status: She is alert and oriented to person, place, and time. Mental status is at baseline.  Psychiatric:        Mood and Affect: Mood normal.        Behavior: Behavior normal.     No results found for any visits on 08/08/21.  Assessment & Plan     1. Sore throat Could be pharyngitis/tonsillitis/URI - POCT Influenza A/B neg? inconclusive - POCT rapid strep A neg - amoxicillin  (AMOXIL) 500 MG tablet; Take 1 tablet (500 mg total) by mouth 2 (two) times daily.  Dispense: 20 tablet; Refill: 0  Continue with symptomatic treatment.  Patient was instructed not to fill it unless their symptoms worsen   Discussed the potential negative side effects of antibiotics and that they can lead to resistance if used unnecessarily Discussed expectations for duration of symptoms. Discussed that they may feel bad for up to a week, but sometimes symptoms last longer (especially if you smoke)  Explained that you can get multiple viruses in a row and recommend prevention strategies such as hand washing Made a plan with the patient to outline what to do if symptoms worsen or do not improve, or if they develop concerning symptoms like high fever, SOB  Work note was provided  FU if needed    The patient was advised to call back or seek an in-person evaluation if the symptoms worsen or if the condition fails to improve as anticipated.  I discussed the assessment and treatment plan with the patient. The patient was provided an opportunity to ask questions and all were answered. The patient agreed with the plan and demonstrated an understanding of the instructions.  The entirety of the information documented in the History of Present Illness, Review of Systems and Physical Exam were personally obtained by me. Portions of this information were initially documented by the CMA and reviewed by me for thoroughness and accuracy.  Portions of this note were created using dictation software and may contain typographical errors.     Debera Lat, PA-C  [facility] [phone] (phone) [phone] (fax)  [facility]

## 2021-09-07 ENCOUNTER — Ambulatory Visit: Payer: Self-pay | Admitting: *Deleted

## 2021-09-07 ENCOUNTER — Telehealth: Payer: Medicaid Other | Admitting: Physician Assistant

## 2021-09-07 DIAGNOSIS — R3989 Other symptoms and signs involving the genitourinary system: Secondary | ICD-10-CM

## 2021-09-07 MED ORDER — NITROFURANTOIN MONOHYD MACRO 100 MG PO CAPS: 100.0000 mg | ORAL_CAPSULE | Freq: Two times a day (BID) | ORAL | 0 refills | Status: DC
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
## 2021-09-07 NOTE — Telephone Encounter (Signed)
  Chief Complaint: uti symptoms Symptoms: dysuria Frequency: constant Pertinent Negatives: Patient denies fever, blood in urine, flank pain Disposition: [] ED /[] Urgent Care (no appt availability in office) / [] Appointment(In office/virtual)/ [x]  [facility]/ [] Home Care/ [] Refused Recommended Disposition /[]  Mobile Bus/ []  Follow-up with PCP Additional Notes: There are no appts open in office for a week, there was a virtual appt open in 3 days. Pt set up for a MyChart virtual UC visit. If needs to drop urine by office she can.  Reason for Disposition  Urinating more frequently than usual (i.e., frequency)  Answer Assessment - Initial Assessment Questions 1. SYMPTOM: "What's the main symptom you're concerned about?" (e.g., frequency, incontinence)     Burning 2. ONSET: "When did the  burning  start?"     4 days 3. PAIN: "Is there any pain?" If Yes, ask: "How bad is it?" (Scale: 1-10; mild, moderate, severe)     Yes 9 4. CAUSE: "What do you think is causing the symptoms?"     UTI 5. OTHER SYMPTOMS: "Do you have any other symptoms?" (e.g., blood in urine, fever, flank pain, pain with urination)     No blood, no fever or flank pain 6. PREGNANCY: "Is there any chance you are pregnant?" "When was your last menstrual period?"     No  Protocols used: Urinary Symptoms-A-AH

## 2021-09-07 NOTE — Patient Instructions (Signed)
Melissa Barr, thank you for joining [name], PA-C for today's virtual visit.  While this provider is not your primary care provider (PCP), if your PCP is located in our provider database this encounter information will be shared with them immediately following your visit.  Consent: (Patient) Melissa Barr provided verbal consent for this virtual visit at the beginning of the encounter.  Current Medications:  Current Outpatient Medications:    nitrofurantoin, macrocrystal-monohydrate, (MACROBID) 100 MG capsule, Take 1 capsule (100 mg total) by mouth 2 (two) times daily., Disp: 10 capsule, Rfl: 0   amoxicillin (AMOXIL) 500 MG tablet, Take 1 tablet (500 mg total) by mouth 2 (two) times daily., Disp: 20 tablet, Rfl: 0   azelastine (ASTELIN) 0.1 % nasal spray, Place 2 sprays into both nostrils daily. Use in each nostril as directed, Disp: 30 mL, Rfl: 3   etonogestrel (NEXPLANON) 68 MG IMPL implant, 1 each by Subdermal route once., Disp: , Rfl:    Medications ordered in this encounter:  Meds ordered this encounter  Medications   nitrofurantoin, macrocrystal-monohydrate, (MACROBID) 100 MG capsule    Sig: Take 1 capsule (100 mg total) by mouth 2 (two) times daily.    Dispense:  10 capsule    Refill:  0    Order Specific Question:   Supervising Provider    Answer:   Hyacinth Meeker, BRIAN [[id number]]     *If you need refills on other medications prior to your next appointment, please contact your pharmacy*  Follow-Up: Call back or seek an in-person evaluation if the symptoms worsen or if the condition fails to improve as anticipated.  Other Instructions Urinary Tract Infection, Adult  A urinary tract infection (UTI) is an infection of any part of the urinary tract. The urinary tract includes the kidneys, ureters, bladder, and urethra. These organs make, store, and get rid of urine in the body. An upper UTI affects the ureters and kidneys. A lower UTI affects the bladder and  urethra. What are the causes? Most urinary tract infections are caused by bacteria in your genital area around your urethra, where urine leaves your body. These bacteria grow and cause inflammation of your urinary tract. What increases the risk? You are more likely to develop this condition if: You have a urinary catheter that stays in place. You are not able to control when you urinate or have a bowel movement (incontinence). You are female and you: Use a spermicide or diaphragm for birth control. Have low estrogen levels. Are pregnant. You have certain genes that increase your risk. You are sexually active. You take antibiotic medicines. You have a condition that causes your flow of urine to slow down, such as: An enlarged prostate, if you are female. Blockage in your urethra. A kidney stone. A nerve condition that affects your bladder control (neurogenic bladder). Not getting enough to drink, or not urinating often. You have certain medical conditions, such as: Diabetes. A weak disease-fighting system (immunesystem). Sickle cell disease. Gout. Spinal cord injury. What are the signs or symptoms? Symptoms of this condition include: Needing to urinate right away (urgency). Frequent urination. This may include small amounts of urine each time you urinate. Pain or burning with urination. Blood in the urine. Urine that smells bad or unusual. Trouble urinating. Cloudy urine. Vaginal discharge, if you are female. Pain in the abdomen or the lower back. You may also have: Vomiting or a decreased appetite. Confusion. Irritability or tiredness. A fever or chills. Diarrhea. The first symptom in  older adults may be confusion. In some cases, they may not have any symptoms until the infection has worsened. How is this diagnosed? This condition is diagnosed based on your medical history and a physical exam. You may also have other tests, including: Urine tests. Blood tests. Tests for  STIs (sexually transmitted infections). If you have had more than one UTI, a cystoscopy or imaging studies may be done to determine the cause of the infections. How is this treated? Treatment for this condition includes: Antibiotic medicine. Over-the-counter medicines to treat discomfort. Drinking enough water to stay hydrated. If you have frequent infections or have other conditions such as a kidney stone, you may need to see a health care provider who specializes in the urinary tract (urologist). In rare cases, urinary tract infections can cause sepsis. Sepsis is a life-threatening condition that occurs when the body responds to an infection. Sepsis is treated in the hospital with IV antibiotics, fluids, and other medicines. Follow these instructions at home:  Medicines Take over-the-counter and prescription medicines only as told by your health care provider. If you were prescribed an antibiotic medicine, take it as told by your health care provider. Do not stop using the antibiotic even if you start to feel better. General instructions Make sure you: Empty your bladder often and completely. Do not hold urine for long periods of time. Empty your bladder after sex. Wipe from front to back after urinating or having a bowel movement if you are female. Use each tissue only one time when you wipe. Drink enough fluid to keep your urine pale yellow. Keep all follow-up visits. This is important. Contact a health care provider if: Your symptoms do not get better after 1-2 days. Your symptoms go away and then return. Get help right away if: You have severe pain in your back or your lower abdomen. You have a fever or chills. You have nausea or vomiting. Summary A urinary tract infection (UTI) is an infection of any part of the urinary tract, which includes the kidneys, ureters, bladder, and urethra. Most urinary tract infections are caused by bacteria in your genital area. Treatment for this  condition often includes antibiotic medicines. If you were prescribed an antibiotic medicine, take it as told by your health care provider. Do not stop using the antibiotic even if you start to feel better. Keep all follow-up visits. This is important. This information is not intended to replace advice given to you by your health care provider. Make sure you discuss any questions you have with your health care provider. Document Revised: 09/17/2019 Document Reviewed: 09/17/2019 Elsevier Patient Education  2023 Elsevier Inc.    If you have been instructed to have an in-person evaluation today at a local Urgent Care facility, please use the link below. It will take you to a list of all of our available [facility], including address, phone number and hours of operation. Please do not delay care.  [facility]  If you or a family member do not have a primary care provider, use the link below to schedule a visit and establish care. When you choose a [facility] primary care physician or advanced practice provider, you gain a long-term partner in health. Find a Primary Care Provider  Learn more about [facility]'s in-office and virtual care options: [facility] - Get Care Now

## 2021-12-21 ENCOUNTER — Ambulatory Visit: Payer: Medicaid Other | Admitting: Physician Assistant

## 2021-12-21 ENCOUNTER — Ambulatory Visit: Payer: Self-pay | Admitting: *Deleted

## 2021-12-21 VITALS — BP 99/61 | HR 90 | Temp 98.2°F | Wt 127.0 lb

## 2021-12-21 DIAGNOSIS — R21 Rash and other nonspecific skin eruption: Secondary | ICD-10-CM

## 2021-12-21 MED ORDER — HYDROXYZINE PAMOATE 25 MG PO CAPS: 25.0000 mg | ORAL_CAPSULE | Freq: Three times a day (TID) | ORAL | 0 refills | Status: DC | PRN

## 2021-12-21 NOTE — Progress Notes (Unsigned)
Vivien Rota DeSanto,acting as a Neurosurgeon for [employer], PA-C.,have documented all relevant documentation on the behalf of Debera Lat, PA-C,as directed by  [employer], PA-C while in the presence of [employer], PA-C.    Established patient visit   Patient: Melissa Barr   DOB: 02-Apr-1995   26 y.o. Female  MRN: [id number] Visit Date: 12/21/2021  Today's healthcare provider: Debera Lat, PA-C   No chief complaint on file.  Subjective    HPI  Patient is a 26 year old female who presents for evaluation of a rash around her mouth.  Patient states that she broke out 5 days ago.  She has not been sexually active in 2 years and has just recently started seeing someone fears this may be sexually transmitted.   The rash is described as being on her chin.  Small raised red bumps with dryness of the skin.  She reports slight itching, similar to when you have dry skin.  She denies burning or pain, except that she thinks she has irritated the area with washing it so much that her lips became chapped and she has discomfort from that.   Patient has history of eczema but reports this seems different.  She does note that when it first broke out she had just showered and then put vitamin e gel on her face.  She states she has done this before with no reaction but did not it was right after that her skin was irritated. Patient also reports that her son was sick and had fever early in there week, then her daughter ran fever.  Last night her new partner told her that he had a fever and was not feeling well.  Medications: Outpatient Medications Prior to Visit  Medication Sig   etonogestrel (NEXPLANON) 68 MG IMPL implant 1 each by Subdermal route once.   amoxicillin (AMOXIL) 500 MG tablet Take 1 tablet (500 mg total) by mouth 2 (two) times daily. (Patient not taking: Reported on 12/21/2021)   azelastine (ASTELIN) 0.1 % nasal spray Place 2 sprays into both nostrils daily. Use in each nostril as  directed (Patient not taking: Reported on 12/21/2021)   nitrofurantoin, macrocrystal-monohydrate, (MACROBID) 100 MG capsule Take 1 capsule (100 mg total) by mouth 2 (two) times daily. (Patient not taking: Reported on 12/21/2021)   No facility-administered medications prior to visit.    Review of Systems  {Labs  Heme  Chem  Endocrine  Serology  Results Review (optional):23779}   Objective    BP 99/61 (BP Location: Right Arm, Patient Position: Sitting, Cuff Size: Normal)   Pulse 90   Temp 98.2 F (36.8 C) (Oral)   Wt 127 lb (57.6 kg)   SpO2 99%   BMI 23.23 kg/m  {Show previous vital signs (optional):23777}  Physical Exam Constitutional:      General: She is not in acute distress.    Appearance: Normal appearance.  HENT:     Head: Normocephalic.  Pulmonary:     Effort: Pulmonary effort is normal. No respiratory distress.  Neurological:     Mental Status: She is alert and oriented to person, place, and time. Mental status is at baseline.     ***  No results found for any visits on 12/21/21.  Assessment & Plan     1. Rash Perioral pruritus  Hx of atopic dermatitis Could be due to allergy or virus Will start with antihistamines - hydrOXYzine (VISTARIL) 25 MG capsule; Take 1 capsule (25 mg total) by  mouth every 8 (eight) hours as needed.  Dispense: 30 capsule; Refill: 0 Benefits and risks were discussed. Patient agreed and expressed her understanding.  Considering recent intercourse, partner having a rash/eczema in the groin area and upper respiratory infection in children over-the-counter Abreva was advised to prevent to manage herpetic eruption Patient denies having vaginal discharge Advised to schedule an appt for her partner. Will check on her on Monday  FU      The patient was advised to call back or seek an in-person evaluation if the symptoms worsen or if the condition fails to improve as anticipated.  I discussed the assessment and treatment plan with the  patient. The patient was provided an opportunity to ask questions and all were answered. The patient agreed with the plan and demonstrated an understanding of the instructions.  The entirety of the information documented in the History of Present Illness, Review of Systems and Physical Exam were personally obtained by me. Portions of this information were initially documented by the CMA and reviewed by me for thoroughness and accuracy.  Portions of this note were created using dictation software and may contain typographical errors.     Mardene Speak, PA-C  [facility] [phone] (phone) [phone] (fax)  [place]

## 2021-12-21 NOTE — Telephone Encounter (Signed)
  Chief Complaint: rash around mouth Symptoms: painful and itches Frequency: started after unprotected sex Pertinent Negatives: Patient denies rash anywhere else Disposition: [] ED /[] Urgent Care (no appt availability in office) / [x] Appointment(In office/virtual)/ []  [facility]/ [] Home Care/ [] Refused Recommended Disposition /[] [facility]/ []  Follow-up with PCP Additional Notes: Visit this morning, home care discussed, pt using multiple products at home.  Answer Assessment - Initial Assessment Questions 1. APPEARANCE of RASH: "Describe the rash."      Around mouth 2. LOCATION: "Where is the rash located?"      Bumps and red 3. NUMBER: "How many spots are there?"      unknown 4. SIZE: "How big are the spots?" (Inches, centimeters or compare to size of a coin)      small 5. ONSET: "When did the rash start?"      Sunday 6. ITCHING: "Does the rash itch?" If Yes, ask: "How bad is the itch?"  (Scale 0-10; or none, mild, moderate, severe)     Friday or Saturday  7. PAIN: "Does the rash hurt?" If Yes, ask: "How bad is the pain?"  (Scale 0-10; or none, mild, moderate, severe)    - NONE (0): no pain    - MILD (1-3): doesn't interfere with normal activities     - MODERATE (4-7): interferes with normal activities or awakens from sleep     - SEVERE (8-10): excruciating pain, unable to do any normal activities     Hurts not itches washing it and using aquafore and neosporin 8. OTHER SYMPTOMS: "Do you have any other symptoms?" (e.g., fever)     no 9. PREGNANCY: "Is there any chance you are pregnant?" "When was your last menstrual period?"     no  Protocols used: Rash or Redness - Localized-A-AH

## 2021-12-24 ENCOUNTER — Encounter: Payer: Self-pay | Admitting: Physician Assistant

## 2022-02-20 ENCOUNTER — Ambulatory Visit (INDEPENDENT_AMBULATORY_CARE_PROVIDER_SITE_OTHER): Payer: Medicaid Other | Admitting: Physician Assistant

## 2022-02-20 ENCOUNTER — Encounter: Payer: Self-pay | Admitting: Physician Assistant

## 2022-02-20 VITALS — BP 91/91 | HR 84 | Ht 62.0 in | Wt 120.0 lb

## 2022-02-20 DIAGNOSIS — R051 Acute cough: Secondary | ICD-10-CM

## 2022-02-20 DIAGNOSIS — J029 Acute pharyngitis, unspecified: Secondary | ICD-10-CM

## 2022-02-20 LAB — POCT RAPID STREP A (OFFICE): Rapid Strep A Screen: NEGATIVE

## 2022-02-20 MED ORDER — BENZONATATE 100 MG PO CAPS: 100.0000 mg | ORAL_CAPSULE | Freq: Two times a day (BID) | ORAL | 0 refills | Status: DC | PRN

## 2022-02-20 NOTE — Progress Notes (Signed)
I,Sha'taria Tyson,acting as a Education administrator for [employer], PA-C.,have documented all relevant documentation on the behalf of Mikey Kirschner, PA-C,as directed by  Mikey Kirschner, PA-C while in the presence of Mikey Kirschner, PA-C.   Established patient visit   Patient: Melissa Barr   DOB: 06-16-1995   27 y.o. Female  MRN: [id number] Visit Date: 02/20/2022  Today's healthcare provider: Mikey Kirschner, PA-C   Cc. Sore throat, cough  Subjective     Pt reports cough x 3 weeks with some mild improvement, tried mucinex ,dayquil, ibuprofen. Her daughter was diagnosed with strep throat a few days ago and the pt's throat started to hurt a few days ago as well.   Medications: Outpatient Medications Prior to Visit  Medication Sig   etonogestrel (NEXPLANON) 68 MG IMPL implant 1 each by Subdermal route once.   hydrOXYzine (VISTARIL) 25 MG capsule Take 1 capsule (25 mg total) by mouth every 8 (eight) hours as needed.   amoxicillin (AMOXIL) 500 MG tablet Take 1 tablet (500 mg total) by mouth 2 (two) times daily. (Patient not taking: Reported on 12/21/2021)   azelastine (ASTELIN) 0.1 % nasal spray Place 2 sprays into both nostrils daily. Use in each nostril as directed (Patient not taking: Reported on 12/21/2021)   nitrofurantoin, macrocrystal-monohydrate, (MACROBID) 100 MG capsule Take 1 capsule (100 mg total) by mouth 2 (two) times daily. (Patient not taking: Reported on 12/21/2021)   No facility-administered medications prior to visit.    Review of Systems  HENT:  Positive for congestion.   Respiratory:  Positive for cough.       Objective    Blood pressure (!) 91/91, pulse 84, height 5\' 2"  (1.575 m), weight 120 lb (54.4 kg), last menstrual period 02/18/2022, SpO2 100 %.   Physical Exam Constitutional:      General: She is awake.     Appearance: She is well-developed.  HENT:     Head: Normocephalic.     Mouth/Throat:     Pharynx: Posterior oropharyngeal erythema present. No  oropharyngeal exudate.  Eyes:     Conjunctiva/sclera: Conjunctivae normal.  Cardiovascular:     Rate and Rhythm: Normal rate and regular rhythm.     Heart sounds: Normal heart sounds.  Pulmonary:     Effort: Pulmonary effort is normal.     Breath sounds: Normal breath sounds.  Skin:    General: Skin is warm.  Neurological:     Mental Status: She is alert and oriented to person, place, and time.  Psychiatric:        Attention and Perception: Attention normal.        Mood and Affect: Mood normal.        Speech: Speech normal.        Behavior: Behavior is cooperative.     Results for orders placed or performed in visit on 02/20/22  POCT rapid strep A  Result Value Ref Range   Rapid Strep A Screen Negative Negative    Assessment & Plan     Acute pharyngitis POC strep negative but given exposure sending for culture  2. Cough Likely post-viral advised otc antihistamines Rx tessalon for cough  Return if symptoms worsen or fail to improve.     I, Mikey Kirschner, PA-C have reviewed all documentation for this visit. The documentation on  02/20/2022 for the exam, diagnosis, procedures, and orders are all accurate and complete.  Mikey Kirschner, PA-C [facility] [address] [place], [place], [address] Office: [phone]  Fax: [place]

## 2022-02-23 LAB — CULTURE, GROUP A STREP: Strep A Culture: NEGATIVE

## 2022-02-25 ENCOUNTER — Telehealth: Payer: Self-pay | Admitting: Emergency Medicine

## 2022-02-25 DIAGNOSIS — N390 Urinary tract infection, site not specified: Secondary | ICD-10-CM

## 2022-02-25 MED ORDER — CEPHALEXIN 500 MG PO CAPS: 500.0000 mg | ORAL_CAPSULE | Freq: Two times a day (BID) | ORAL | 0 refills | Status: DC

## 2022-02-25 NOTE — Progress Notes (Signed)

## 2022-04-02 ENCOUNTER — Telehealth: Payer: Self-pay | Admitting: Nurse Practitioner

## 2022-04-02 DIAGNOSIS — J069 Acute upper respiratory infection, unspecified: Secondary | ICD-10-CM

## 2022-04-02 NOTE — Progress Notes (Signed)
E visit for Flu like symptoms   We are sorry that you are not feeling well.  Here is how we plan to help! Based on what you have shared with me it looks like you may have flu-like symptoms that should be watched but do not seem to indicate anti-viral treatment.  Influenza or "the flu" is   an infection caused by a respiratory virus. The flu virus is highly contagious and persons who did not receive their yearly flu vaccination may "catch" the flu from close contact.  We have anti-viral medications to treat the viruses that cause this infection. They are not a "cure" and only shorten the course of the infection. These prescriptions are most effective when they are given within the first 2 days of "flu" symptoms.   It appears you are outside of the window for Tamiflu.  Your images of the throat do not look concerning for strep throat    Based upon your symptoms and potential risk factors I recommend that you follow the flu symptoms recommendation that I have listed below.  ANYONE WHO HAS FLU SYMPTOMS SHOULD: Stay home. The flu is highly contagious and going out or to work exposes others! Be sure to drink plenty of fluids. Water is fine as well as fruit juices, sodas and electrolyte beverages. You may want to stay away from caffeine or alcohol. If you are nauseated, try taking small sips of liquids. How do you know if you are getting enough fluid? Your urine should be a pale yellow or almost colorless. Get rest. Taking a steamy shower or using a humidifier may help nasal congestion and ease sore throat pain. Using a saline nasal spray works much the same way. Cough drops, hard candies and sore throat lozenges may ease your cough. Cepecal lozenges Use an over the counter cold/flu medication like Dayquil and Nyquil for symptom relief  Line up a caregiver. Have someone check on you regularly.   GET HELP RIGHT AWAY IF: You cannot keep down liquids or your medications. You become short of  breath Your fell like you are going to pass out or loose consciousness. Your symptoms persist after you have completed your treatment plan MAKE SURE YOU  Understand these instructions. Will watch your condition. Will get help right away if you are not doing well or get worse.  Your e-visit answers were reviewed by a board certified advanced clinical practitioner to complete your personal care plan.  Depending on the condition, your plan could have included both over the counter or prescription medications.  If there is a problem please reply  once you have received a response from your provider.  Your safety is important to [place].  If you have drug allergies check your prescription carefully.    You can use MyChart to ask questions about today's visit, request a non-urgent call back, or ask for a work or school excuse for 24 hours related to this e-Visit. If it has been greater than 24 hours you will need to follow up with your provider, or enter a new e-Visit to address those concerns.  You will get an e-mail in the next two days asking about your experience.  I hope that your e-visit has been valuable and will speed your recovery. Thank you for using e-visits.  I spent approximately 5 minutes reviewing the patient's history, current symptoms and coordinating their care today.

## 2022-04-19 ENCOUNTER — Telehealth: Payer: Self-pay | Admitting: Nurse Practitioner

## 2022-04-19 DIAGNOSIS — N3000 Acute cystitis without hematuria: Secondary | ICD-10-CM

## 2022-04-19 MED ORDER — NITROFURANTOIN MONOHYD MACRO 100 MG PO CAPS: 100.0000 mg | ORAL_CAPSULE | Freq: Two times a day (BID) | ORAL | 0 refills | Status: AC

## 2022-04-19 NOTE — Progress Notes (Signed)
E-Visit for Urinary Problems  We are sorry that you are not feeling well.  Here is how we plan to help!  Based on what you shared with me it looks like you most likely have a simple urinary tract infection. Please note since this is the third time we are treating you for a UTI we will suggest that the next time you have symptoms you are seen in person for urine testing and culture.   A UTI (Urinary Tract Infection) is a bacterial infection of the bladder.  Most cases of urinary tract infections are simple to treat but a key part of your care is to encourage you to drink plenty of fluids and watch your symptoms carefully.  I have prescribed MacroBid 100 mg twice a day for 5 days.  Your symptoms should gradually improve. Call us if the burning in your urine worsens, you develop worsening fever, back pain or pelvic pain or if your symptoms do not resolve after completing the antibiotic.  Urinary tract infections can be prevented by drinking plenty of water to keep your body hydrated.  Also be sure when you wipe, wipe from front to back and don't hold it in!  If possible, empty your bladder every 4 hours.  HOME CARE Drink plenty of fluids Compete the full course of the antibiotics even if the symptoms resolve Remember, when you need to go.go. Holding in your urine can increase the likelihood of getting a UTI! GET HELP RIGHT AWAY IF: You cannot urinate You get a high fever Worsening back pain occurs You see blood in your urine You feel sick to your stomach or throw up You feel like you are going to pass out  MAKE SURE YOU  Understand these instructions. Will watch your condition. Will get help right away if you are not doing well or get worse.   Thank you for choosing an e-visit.  Your e-visit answers were reviewed by a board certified advanced clinical practitioner to complete your personal care plan. Depending upon the condition, your plan could have included both over the counter or  prescription medications.  Please review your pharmacy choice. Make sure the pharmacy is open so you can pick up prescription now. If there is a problem, you may contact your provider through [employer] and have the prescription routed to another pharmacy.  Your safety is important to [place]. If you have drug allergies check your prescription carefully.   For the next 24 hours you can use MyChart to ask questions about today's visit, request a non-urgent call back, or ask for a work or school excuse. You will get an email in the next two days asking about your experience. I hope that your e-visit has been valuable and will speed your recovery.   Meds ordered this encounter  Medications   nitrofurantoin, macrocrystal-monohydrate, (MACROBID) 100 MG capsule    Sig: Take 1 capsule (100 mg total) by mouth 2 (two) times daily for 5 days.    Dispense:  10 capsule    Refill:  0    I spent approximately 5 minutes reviewing the patient's history, current symptoms and coordinating their care today.

## 2022-04-23 ENCOUNTER — Ambulatory Visit (INDEPENDENT_AMBULATORY_CARE_PROVIDER_SITE_OTHER): Payer: Self-pay | Admitting: Physician Assistant

## 2022-04-23 ENCOUNTER — Encounter: Payer: Self-pay | Admitting: Physician Assistant

## 2022-04-23 VITALS — BP 99/66 | HR 96 | Ht 62.0 in | Wt 124.9 lb

## 2022-04-23 DIAGNOSIS — R399 Unspecified symptoms and signs involving the genitourinary system: Secondary | ICD-10-CM

## 2022-04-23 DIAGNOSIS — N3000 Acute cystitis without hematuria: Secondary | ICD-10-CM

## 2022-04-23 LAB — POCT URINALYSIS DIPSTICK
Bilirubin, UA: NEGATIVE
Blood, UA: NEGATIVE
Glucose, UA: NEGATIVE
Ketones, UA: NEGATIVE
Leukocytes, UA: NEGATIVE
Nitrite, UA: NEGATIVE
Protein, UA: NEGATIVE
Spec Grav, UA: 1.015 (ref 1.010–1.025)
Urobilinogen, UA: 0.2 E.U./dL
pH, UA: 5 (ref 5.0–8.0)

## 2022-04-23 NOTE — Progress Notes (Signed)
I,Sha'taria Tyson,acting as a Education administrator for [employer], PA-C.,have documented all relevant documentation on the behalf of Mikey Kirschner, PA-C,as directed by  Mikey Kirschner, PA-C while in the presence of Mikey Kirschner, PA-C.   Established patient visit   Patient: Melissa Barr   DOB: Oct 31, 1995   27 y.o. Female  MRN: [id number] Visit Date: 04/23/2022  Today's healthcare provider: Mikey Kirschner, PA-C   Cc. UTI  Subjective    HPI  Urinary symptoms  She reports new onset. The current episode started  11 days ago and is gradually improving. Patient states symptoms are mild in intensity, occurring constantly. She  has been recently treated for similar symptoms.    She had a virtual appointment and was given macrobid, she has one more day of treatment. She reports her symptoms have improved but there still is some intermittent dysuria She is sexually active; denies STI concerns.  Associated symptoms: No abdominal pain Yes back pain  Yes chills Yes constipation  No cramping No diarrhea  No discharge No fever  Yes hematuria Yes nausea  No vomiting    ---------------------------------------------------------------------------------------   Medications: Outpatient Medications Prior to Visit  Medication Sig   benzonatate (TESSALON) 100 MG capsule Take 1 capsule (100 mg total) by mouth 2 (two) times daily as needed for cough.   cephALEXin (KEFLEX) 500 MG capsule Take 1 capsule (500 mg total) by mouth 2 (two) times daily.   etonogestrel (NEXPLANON) 68 MG IMPL implant 1 each by Subdermal route once.   hydrOXYzine (VISTARIL) 25 MG capsule Take 1 capsule (25 mg total) by mouth every 8 (eight) hours as needed.   nitrofurantoin, macrocrystal-monohydrate, (MACROBID) 100 MG capsule Take 1 capsule (100 mg total) by mouth 2 (two) times daily for 5 days.   azelastine (ASTELIN) 0.1 % nasal spray Place 2 sprays into both nostrils daily. Use in each nostril as directed (Patient not taking:  Reported on 04/23/2022)   No facility-administered medications prior to visit.    Review of Systems  Constitutional:  Negative for fatigue and fever.  Respiratory:  Negative for cough and shortness of breath.   Cardiovascular:  Negative for chest pain and leg swelling.  Gastrointestinal:  Negative for abdominal pain.  Genitourinary:  Positive for dysuria.  Neurological:  Negative for dizziness and headaches.       Objective    BP 99/66 (BP Location: Right Arm, Patient Position: Sitting, Cuff Size: Normal)   Pulse 96   Ht '5\' 2"'$  (1.575 m)   Wt 124 lb 14.4 oz (56.7 kg)   SpO2 100%   BMI 22.84 kg/m    Physical Exam Vitals reviewed.  Constitutional:      Appearance: She is not ill-appearing.  HENT:     Head: Normocephalic.  Eyes:     Conjunctiva/sclera: Conjunctivae normal.  Cardiovascular:     Rate and Rhythm: Normal rate.  Pulmonary:     Effort: Pulmonary effort is normal. No respiratory distress.  Neurological:     General: No focal deficit present.     Mental Status: She is alert and oriented to person, place, and time.  Psychiatric:        Mood and Affect: Mood normal.        Behavior: Behavior normal.     No results found for any visits on 04/23/22.  Assessment & Plan     Acute cystitis Urine culture ordered Will also check urine for g/c/trich . Will contact pt with results and further treatment as  indicated  Return if symptoms worsen or fail to improve.      I, Mikey Kirschner, PA-C have reviewed all documentation for this visit. The documentation on  04/23/22  for the exam, diagnosis, procedures, and orders are all accurate and complete.  Mikey Kirschner, PA-C [facility] [address] [place], [place], [address] Office: [phone] Fax: [place]

## 2022-04-24 ENCOUNTER — Other Ambulatory Visit: Payer: Self-pay

## 2022-04-24 ENCOUNTER — Telehealth: Payer: Self-pay

## 2022-04-24 ENCOUNTER — Other Ambulatory Visit (HOSPITAL_COMMUNITY)
Admission: RE | Admit: 2022-04-24 | Discharge: 2022-04-24 | Disposition: A | Discharge status: Home / Self Care | Payer: Medicaid Other | Source: Ambulatory Visit | Attending: Physician Assistant | Admitting: Physician Assistant

## 2022-04-24 DIAGNOSIS — N3000 Acute cystitis without hematuria: Secondary | ICD-10-CM | POA: Diagnosis not present

## 2022-04-24 DIAGNOSIS — R399 Unspecified symptoms and signs involving the genitourinary system: Secondary | ICD-10-CM

## 2022-04-24 LAB — URINE CYTOLOGY ANCILLARY ONLY
Chlamydia: NEGATIVE
Comment: NEGATIVE
Comment: NEGATIVE
Comment: NORMAL
Neisseria Gonorrhea: NEGATIVE
Trichomonas: NEGATIVE

## 2022-04-24 NOTE — Progress Notes (Signed)
Order updated due to incorrect order being placed yesterday as advised by [facility] Order [id number] (cytology - non gyn) cancelled

## 2022-04-24 NOTE — Telephone Encounter (Signed)
Copied from [place] [id number]. Topic: General - Other >> Apr 24, 2022 10:31 AM Sabas Sous wrote: Reason for CRM: [facility] called to report that the recent order needs to be resubmitted as a cytology ancillary order.   Best contact: [phone] Lynelle Smoke)

## 2022-04-24 NOTE — Telephone Encounter (Signed)
Order cancelled and correct order placed.

## 2022-04-25 LAB — URINE CULTURE: Organism ID, Bacteria: NO GROWTH

## 2022-04-25 LAB — SPECIMEN STATUS REPORT

## 2022-05-17 ENCOUNTER — Telehealth: Payer: Self-pay | Admitting: Physician Assistant

## 2022-05-17 DIAGNOSIS — R110 Nausea: Secondary | ICD-10-CM

## 2022-05-17 MED ORDER — ONDANSETRON HCL 4 MG PO TABS: 4.0000 mg | ORAL_TABLET | Freq: Three times a day (TID) | ORAL | 0 refills | Status: DC | PRN

## 2022-05-17 NOTE — Progress Notes (Signed)
E-Visit for Nausea and Vomiting   We are sorry that you are not feeling well. Here is how we plan to help!  Based on what you have shared with me it looks like you have a Virus that is irritating your GI tract.  Vomiting is the forceful emptying of a portion of the stomach's content through the mouth.  Although nausea and vomiting can make you feel miserable, it's important to remember that these are not diseases, but rather symptoms of an underlying illness.  When we treat short term symptoms, we always caution that any symptoms that persist should be fully evaluated in a medical office.  I have prescribed a medication that will help alleviate your symptoms and allow you to stay hydrated:  Zofran 4 mg 1 tablet every 8 hours as needed for nausea and vomiting  HOME CARE: Drink clear liquids.  This is very important! Dehydration (the lack of fluid) can lead to a serious complication.  Start off with 1 tablespoon every 5 minutes for 8 hours. You may begin eating bland foods after 8 hours without vomiting.  Start with saltine crackers, white bread, rice, mashed potatoes, applesauce. After 48 hours on a bland diet, you may resume a normal diet. Try to go to sleep.  Sleep often empties the stomach and relieves the need to vomit.  GET HELP RIGHT AWAY IF:  Your symptoms do not improve or worsen within 2 days after treatment. You have a fever for over 3 days. You cannot keep down fluids after trying the medication.  MAKE SURE YOU:  Understand these instructions. Will watch your condition. Will get help right away if you are not doing well or get worse.    Thank you for choosing an e-visit.  Your e-visit answers were reviewed by a board certified advanced clinical practitioner to complete your personal care plan. Depending upon the condition, your plan could have included both over the counter or prescription medications.  Please review your pharmacy choice. Make sure the pharmacy is open so  you can pick up prescription now. If there is a problem, you may contact your provider through MyChart messaging and have the prescription routed to another pharmacy.  Your safety is important to us. If you have drug allergies check your prescription carefully.   For the next 24 hours you can use MyChart to ask questions about today's visit, request a non-urgent call back, or ask for a work or school excuse. You will get an email in the next two days asking about your experience. I hope that your e-visit has been valuable and will speed your recovery.  I have spent 5 minutes in review of e-visit questionnaire, review and updating patient chart, medical decision making and response to patient.   Troi Florendo M Jayona Mccaig, PA-C  

## 2022-08-08 ENCOUNTER — Telehealth: Payer: Medicaid Other | Admitting: Family Medicine

## 2022-08-08 DIAGNOSIS — R3989 Other symptoms and signs involving the genitourinary system: Secondary | ICD-10-CM

## 2022-08-08 MED ORDER — NITROFURANTOIN MONOHYD MACRO 100 MG PO CAPS: 100.0000 mg | ORAL_CAPSULE | Freq: Two times a day (BID) | ORAL | 0 refills | Status: AC

## 2022-08-08 NOTE — Progress Notes (Signed)

## 2022-11-05 ENCOUNTER — Telehealth: Payer: Self-pay

## 2022-11-05 NOTE — Telephone Encounter (Signed)
Are we still removing Nexplanon? I know she may need appointment for removal another day.

## 2022-11-05 NOTE — Telephone Encounter (Signed)
Yes. I can remove it. I think Dr Roxan Hockey also removes them.  Will need a 40 min procedure visit. We do not have nexplanons to replace it, but can remove it.

## 2022-11-05 NOTE — Telephone Encounter (Signed)
Copied from CRM [id number]. Topic: General - Other >> Nov 05, 2022  1:13 PM Franchot Heidelberg wrote: Reason for CRM: Wants nexplanon birth control removed tomorrow  Best contact: [phone]

## 2022-11-05 NOTE — Telephone Encounter (Signed)
That is up to her. Typically there is no emergency to remove a nexplanon. I do have plenty of openings Thursday afternoon 9/19 though.

## 2022-11-06 NOTE — Telephone Encounter (Signed)
Patient advised. She is not able to do tomorrow. She is scheduled for 10/03 and would like to have birth control pills prescribed then.

## 2022-11-07 NOTE — Telephone Encounter (Signed)
Noted  

## 2022-11-21 ENCOUNTER — Ambulatory Visit: Payer: Medicaid Other | Admitting: Family Medicine

## 2023-01-21 ENCOUNTER — Telehealth: Payer: Medicaid Other | Admitting: Physician Assistant

## 2023-01-21 DIAGNOSIS — B9689 Other specified bacterial agents as the cause of diseases classified elsewhere: Secondary | ICD-10-CM

## 2023-01-21 DIAGNOSIS — J019 Acute sinusitis, unspecified: Secondary | ICD-10-CM

## 2023-01-22 MED ORDER — AMOXICILLIN-POT CLAVULANATE 875-125 MG PO TABS: 1.0000 | ORAL_TABLET | Freq: Two times a day (BID) | ORAL | 0 refills | Status: DC

## 2023-01-22 NOTE — Progress Notes (Signed)

## 2023-01-22 NOTE — Progress Notes (Signed)
I have spent 5 minutes in review of e-visit questionnaire, review and updating patient chart, medical decision making and response to patient.   Mia Milan Cody Jacklynn Dehaas, PA-C    

## 2023-03-12 ENCOUNTER — Telehealth: Payer: Medicaid Other | Admitting: Physician Assistant

## 2023-03-12 DIAGNOSIS — R3989 Other symptoms and signs involving the genitourinary system: Secondary | ICD-10-CM | POA: Diagnosis not present

## 2023-03-12 MED ORDER — NITROFURANTOIN MONOHYD MACRO 100 MG PO CAPS: 100.0000 mg | ORAL_CAPSULE | Freq: Two times a day (BID) | ORAL | 0 refills | Status: DC

## 2023-03-12 NOTE — Progress Notes (Signed)

## 2023-09-09 ENCOUNTER — Ambulatory Visit: Admitting: Physician Assistant

## 2023-11-12 ENCOUNTER — Telehealth: Payer: Self-pay

## 2023-11-12 NOTE — Telephone Encounter (Signed)
 This encounter was created in error - please disregard.

## 2023-11-12 NOTE — Telephone Encounter (Signed)
 Copied from CRM [id number]. Topic: General - Other >> Nov 12, 2023  8:57 AM Dawna HERO wrote: Reason for CRM: says she signed a paper for no vaccinations for her child who sees dr.Robinson but wasn't clear on what that paper was so she states she now has read that paper and doesn't agree with it so she wants to know if it can be shredded or thrown out. >> Nov 12, 2023  9:25 AM Joesph B wrote: Patient was waiting for a nurse and the call dropped. Transferred her back to NT.

## 2023-11-24 ENCOUNTER — Ambulatory Visit: Admitting: Physician Assistant

## 2023-11-24 ENCOUNTER — Encounter: Payer: Self-pay | Admitting: Physician Assistant

## 2023-11-24 VITALS — BP 112/69 | HR 87 | Resp 14 | Ht 62.0 in | Wt 150.9 lb

## 2023-11-24 DIAGNOSIS — Z3046 Encounter for surveillance of implantable subdermal contraceptive: Secondary | ICD-10-CM | POA: Insufficient documentation

## 2023-11-24 DIAGNOSIS — R200 Anesthesia of skin: Secondary | ICD-10-CM | POA: Insufficient documentation

## 2023-11-24 DIAGNOSIS — Z1159 Encounter for screening for other viral diseases: Secondary | ICD-10-CM

## 2023-11-24 DIAGNOSIS — G4489 Other headache syndrome: Secondary | ICD-10-CM | POA: Insufficient documentation

## 2023-11-24 DIAGNOSIS — R5383 Other fatigue: Secondary | ICD-10-CM | POA: Diagnosis not present

## 2023-11-24 DIAGNOSIS — J3089 Other allergic rhinitis: Secondary | ICD-10-CM

## 2023-11-24 DIAGNOSIS — R202 Paresthesia of skin: Secondary | ICD-10-CM | POA: Insufficient documentation

## 2023-11-24 DIAGNOSIS — Z789 Other specified health status: Secondary | ICD-10-CM

## 2023-11-24 DIAGNOSIS — J309 Allergic rhinitis, unspecified: Secondary | ICD-10-CM | POA: Insufficient documentation

## 2023-11-24 DIAGNOSIS — H547 Unspecified visual loss: Secondary | ICD-10-CM | POA: Diagnosis not present

## 2023-11-24 NOTE — Progress Notes (Signed)
 Established patient visit  Patient: Melissa Barr   DOB: Jun 12, 1995   28 y.o. Female  MRN: [id number] Visit Date: 11/24/2023  Today's healthcare provider: Jolynn Spencer, PA-C   Chief Complaint  Patient presents with   Contraception    Nexplanon  removal. 11/06/2020 date placed exp date was 10/2023   Subjective     HPI     Contraception    Additional comments: Nexplanon  removal. 11/06/2020 date placed exp date was 10/2023      Last edited by Wilfred Hargis RAMAN, CMA on 11/24/2023 10:15 AM.       Discussed the use of AI scribe software for clinical note transcription with the patient, who gave verbal consent to proceed.  History of Present Illness Melissa Barr is a 28 year old female who presents with persistent migraines and requests removal of Nexplanon .  She experiences severe, pulsating migraines for almost two months, affecting her entire head and neck. During episodes, her vision fades, requiring rest in a dark room. Sleep is the only relief. She has not been previously diagnosed with migraines and has no similar headache history.  She has used Nexplanon  since 2022 and suspects it may be related to her migraines. During a severe headache, her left arm, where the Nexplanon  is located, and her bottom lip went numb. She believes Nexplanon  may contribute to her symptoms.  Additional symptoms include fatigue, tiredness, and cold intolerance over the past two months. She experiences congestion and muffled sounds in her ears, attributed to allergies. Tylenol  provides some headache relief.  She is a mother to two children and a stepchild, contributing to stress and fatigue. She feels overworked and worries frequently but denies depression or anxiety. No recent trauma, falls, or motor vehicle accidents. No tingling or numbness except during the severe headache episode. No significant changes in sleep pattern, though she has difficulty sleeping due to her responsibilities as a  mother.       11/24/2023   10:16 AM 12/21/2021    8:49 AM 04/11/2021    9:17 AM  Depression screen PHQ 2/9  Decreased Interest 0 0 1  Down, Depressed, Hopeless 0 0 0  PHQ - 2 Score 0 0 1  Altered sleeping  0 2  Tired, decreased energy  0 2  Change in appetite  0 0  Feeling bad or failure about yourself   0 0  Trouble concentrating  0 1  Moving slowly or fidgety/restless  0 0  Suicidal thoughts  0 0  PHQ-9 Score  0 6  Difficult doing work/chores  Not difficult at all Somewhat difficult       No data to display           Medications: Outpatient Medications Prior to Visit  Medication Sig   [DISCONTINUED] amoxicillin -clavulanate (AUGMENTIN ) 875-125 MG tablet Take 1 tablet by mouth 2 (two) times daily.   [DISCONTINUED] azelastine  (ASTELIN ) 0.1 % nasal spray Place 2 sprays into both nostrils daily. Use in each nostril as directed (Patient not taking: Reported on 04/23/2022)   [DISCONTINUED] etonogestrel  (NEXPLANON ) 68 MG IMPL implant 1 each by Subdermal route once.   [DISCONTINUED] hydrOXYzine  (VISTARIL ) 25 MG capsule Take 1 capsule (25 mg total) by mouth every 8 (eight) hours as needed.   [DISCONTINUED] nitrofurantoin , macrocrystal-monohydrate, (MACROBID ) 100 MG capsule Take 1 capsule (100 mg total) by mouth 2 (two) times daily.   [DISCONTINUED] ondansetron  (ZOFRAN ) 4 MG tablet Take 1 tablet (4 mg total) by mouth every 8 (eight) hours as needed  for nausea or vomiting.   No facility-administered medications prior to visit.    Review of Systems All negative Except see HPI       Objective    BP 112/69   Pulse 87   Resp 14   Ht 5' 2 (1.575 m)   Wt 150 lb 14.4 oz (68.4 kg)   SpO2 100%   BMI 27.60 kg/m     Physical Exam Vitals reviewed.  Constitutional:      General: She is not in acute distress.    Appearance: Normal appearance. She is well-developed. She is not diaphoretic.  HENT:     Head: Normocephalic and atraumatic.  Eyes:     General: No scleral  icterus.    Conjunctiva/sclera: Conjunctivae normal.  Neck:     Thyroid: No thyromegaly.  Cardiovascular:     Rate and Rhythm: Normal rate and regular rhythm.     Pulses: Normal pulses.     Heart sounds: Normal heart sounds. No murmur heard. Pulmonary:     Effort: Pulmonary effort is normal. No respiratory distress.     Breath sounds: Normal breath sounds. No wheezing, rhonchi or rales.  Musculoskeletal:     Cervical back: Neck supple.     Right lower leg: No edema.     Left lower leg: No edema.  Lymphadenopathy:     Cervical: No cervical adenopathy.  Skin:    General: Skin is warm and dry.     Findings: No rash.  Neurological:     Mental Status: She is alert and oriented to person, place, and time. Mental status is at baseline.  Psychiatric:        Mood and Affect: Mood normal.        Behavior: Behavior normal.      No results found for any visits on 11/24/23.     Transition of care from [place] [facility]el Assessment & Plan Migraine with prolonged headache and neurological symptoms Migraine with neurological symptoms possibly linked to Nexplanon  implant. No prior history of migraines. Symptoms interfere with daily activities. - Consider MRI/CT  if symptoms persist post-Nexplanon  removal. - Perform blood work to rule out other causes. Continue tylenol   Consider neurology if symptoms persist Will follow-up  Nexplanon  (etonogestrel  implant) removal request Suspected contribution of Nexplanon  to prolonged migraine and neurological symptoms. Removal not possible today. - Place urgent referral to OBGYN for Nexplanon  removal. - Check with colleagues for any available openings for Nexplanon  removal.  Fatigue and sleep disturbance Fatigue and sleep disturbances possibly due to lifestyle, stress, or allergies. - Perform blood work to assess for underlying causes.  Allergic rhinitis (seasonal allergies) Symptoms consistent with seasonal allergies causing nasal congestion and  fatigue. - Recommend Allegra or Claritin for symptom management.  Nexplanon  removal (Primary)  - Ambulatory referral to Obstetrics / Gynecology   Other headache syndrome  - Ambulatory referral to Obstetrics / Gynecology - CBC with Differential/Platelet - Comprehensive metabolic panel with GFR - Hemoglobin A1c - Lipid panel - TSH - T4, free - Vitamin B12 - VITAMIN D 25 Hydroxy (Vit-D Deficiency, Fractures) - Magnesium   Visual problems Associated with headache Will follow-up  Other fatigue  - CBC with Differential/Platelet - Comprehensive metabolic panel with GFR - Hemoglobin A1c - Lipid panel - TSH - T4, free - Vitamin B12 - VITAMIN D 25 Hydroxy (Vit-D Deficiency, Fractures) - Magnesium   Numbness and tingling  - CBC with Differential/Platelet - Comprehensive metabolic panel with GFR - Hemoglobin A1c - Lipid panel - TSH -  T4, free - Vitamin B12 - VITAMIN D 25 Hydroxy (Vit-D Deficiency, Fractures) - Magnesium   Need for hepatitis C screening test Low risk screening - Hepatitis C antibody   Orders Placed This Encounter  Procedures   Ambulatory referral to Obstetrics / Gynecology    Referral Priority:   Urgent    Referral Type:   Consultation    Referral Reason:   Specialty Services Required    Requested Specialty:   Obstetrics and Gynecology    Number of Visits Requested:   1    No follow-ups on file.   The patient was advised to call back or seek an in-person evaluation if the symptoms worsen or if the condition fails to improve as anticipated.  I discussed the assessment and treatment plan with the patient. The patient was provided an opportunity to ask questions and all were answered. The patient agreed with the plan and demonstrated an understanding of the instructions.  I, Tienna Bienkowski, PA-C have reviewed all documentation for this visit. The documentation on 11/24/2023  for the exam, diagnosis, procedures, and orders are all accurate and  complete.  Jolynn Spencer, [facility], MMS [facility] [phone] (phone) [phone] (fax)  [facility]

## 2023-11-25 LAB — COMPREHENSIVE METABOLIC PANEL WITH GFR
ALT: 10 IU/L (ref 0–32)
AST: 12 IU/L (ref 0–40)
Albumin: 4.5 g/dL (ref 4.0–5.0)
Alkaline Phosphatase: 69 IU/L (ref 41–116)
BUN/Creatinine Ratio: 18 (ref 9–23)
BUN: 12 mg/dL (ref 6–20)
Bilirubin Total: 0.3 mg/dL (ref 0.0–1.2)
CO2: 23 mmol/L (ref 20–29)
Calcium: 9.3 mg/dL (ref 8.7–10.2)
Chloride: 102 mmol/L (ref 96–106)
Creatinine, Ser: 0.68 mg/dL (ref 0.57–1.00)
Globulin, Total: 2.3 g/dL (ref 1.5–4.5)
Glucose: 78 mg/dL (ref 70–99)
Potassium: 4 mmol/L (ref 3.5–5.2)
Sodium: 139 mmol/L (ref 134–144)
Total Protein: 6.8 g/dL (ref 6.0–8.5)
eGFR: 122 mL/min/1.73 (ref >59)

## 2023-11-25 LAB — CBC WITH DIFFERENTIAL/PLATELET
Basophils Absolute: 0 x10E3/uL (ref 0.0–0.2)
Basos: 0 %
EOS (ABSOLUTE): 0.3 x10E3/uL (ref 0.0–0.4)
Eos: 2 %
Hematocrit: 39.9 % (ref 34.0–46.6)
Hemoglobin: 12.8 g/dL (ref 11.1–15.9)
Immature Grans (Abs): 0 x10E3/uL (ref 0.0–0.1)
Immature Granulocytes: 0 %
Lymphocytes Absolute: 2.1 x10E3/uL (ref 0.7–3.1)
Lymphs: 18 %
MCH: 29.6 pg (ref 26.6–33.0)
MCHC: 32.1 g/dL (ref 31.5–35.7)
MCV: 92 fL (ref 79–97)
Monocytes Absolute: 0.6 x10E3/uL (ref 0.1–0.9)
Monocytes: 5 %
Neutrophils Absolute: 8.8 x10E3/uL — ABNORMAL HIGH (ref 1.4–7.0)
Neutrophils: 75 %
Platelets: 255 x10E3/uL (ref 150–450)
RBC: 4.33 x10E6/uL (ref 3.77–5.28)
RDW: 11.5 % — ABNORMAL LOW (ref 11.7–15.4)
WBC: 11.8 x10E3/uL — ABNORMAL HIGH (ref 3.4–10.8)

## 2023-11-25 LAB — HEMOGLOBIN A1C
Est. average glucose Bld gHb Est-mCnc: 97 mg/dL
Hgb A1c MFr Bld: 5 % (ref 4.8–5.6)

## 2023-11-25 LAB — LIPID PANEL
Chol/HDL Ratio: 4.5 ratio — ABNORMAL HIGH (ref 0.0–4.4)
Cholesterol, Total: 130 mg/dL (ref 100–199)
HDL: 29 mg/dL — ABNORMAL LOW (ref >39)
LDL Chol Calc (NIH): 66 mg/dL (ref 0–99)
Triglycerides: 212 mg/dL — ABNORMAL HIGH (ref 0–149)
VLDL Cholesterol Cal: 35 mg/dL (ref 5–40)

## 2023-11-25 LAB — MAGNESIUM: Magnesium: 2.1 mg/dL (ref 1.6–2.3)

## 2023-11-25 LAB — TSH: TSH: 4.82 u[IU]/mL — ABNORMAL HIGH (ref 0.450–4.500)

## 2023-11-25 LAB — T4, FREE: Free T4: 1.01 ng/dL (ref 0.82–1.77)

## 2023-11-25 LAB — VITAMIN D 25 HYDROXY (VIT D DEFICIENCY, FRACTURES): Vit D, 25-Hydroxy: 25 ng/mL — ABNORMAL LOW (ref 30.0–100.0)

## 2023-11-25 LAB — VITAMIN B12: Vitamin B-12: 415 pg/mL (ref 232–1245)

## 2023-11-26 ENCOUNTER — Ambulatory Visit: Payer: Self-pay | Admitting: Physician Assistant

## 2023-11-27 ENCOUNTER — Ambulatory Visit: Admitting: Advanced Practice Midwife

## 2023-11-27 ENCOUNTER — Encounter: Payer: Self-pay | Admitting: Advanced Practice Midwife

## 2023-11-27 VITALS — BP 108/67 | HR 89 | Ht 62.0 in | Wt 150.3 lb

## 2023-11-27 DIAGNOSIS — Z3046 Encounter for surveillance of implantable subdermal contraceptive: Secondary | ICD-10-CM | POA: Diagnosis not present

## 2023-11-27 NOTE — Patient Instructions (Addendum)
 Health Maintenance, Female Adopting a healthy lifestyle and getting preventive care are important in promoting health and wellness. Ask your health care provider about: The right schedule for you to have regular tests and exams. Things you can do on your own to prevent diseases and keep yourself healthy. What should I know about diet, weight, and exercise? Eat a healthy diet  Eat a diet that includes plenty of vegetables, fruits, low-fat dairy products, and lean protein. Do not eat a lot of foods that are high in solid fats, added sugars, or sodium. Maintain a healthy weight Body mass index (BMI) is used to identify weight problems. It estimates body fat based on height and weight. Your health care provider can help determine your BMI and help you achieve or maintain a healthy weight. Get regular exercise Get regular exercise. This is one of the most important things you can do for your health. Most adults should: Exercise for at least 150 minutes each week. The exercise should increase your heart rate and make you sweat (moderate-intensity exercise). Do strengthening exercises at least twice a week. This is in addition to the moderate-intensity exercise. Spend less time sitting. Even light physical activity can be beneficial. Watch cholesterol and blood lipids Have your blood tested for lipids and cholesterol at 27 years of age, then have this test every 5 years. Have your cholesterol levels checked more often if: Your lipid or cholesterol levels are high. You are older than 27 years of age. You are at high risk for heart disease. What should I know about cancer screening? Depending on your health history and family history, you may need to have cancer screening at various ages. This may include screening for: Breast cancer. Cervical cancer. Colorectal cancer. Skin cancer. Lung cancer. What should I know about heart disease, diabetes, and high blood pressure? Blood pressure and heart  disease High blood pressure causes heart disease and increases the risk of stroke. This is more likely to develop in people who have high blood pressure readings or are overweight. Have your blood pressure checked: Every 3-5 years if you are 58-87 years of age. Every year if you are 28 years old or older. Diabetes Have regular diabetes screenings. This checks your fasting blood sugar level. Have the screening done: Once every three years after age 19 if you are at a normal weight and have a low risk for diabetes. More often and at a younger age if you are overweight or have a high risk for diabetes. What should I know about preventing infection? Hepatitis B If you have a higher risk for hepatitis B, you should be screened for this virus. Talk with your health care provider to find out if you are at risk for hepatitis B infection. Hepatitis C Testing is recommended for: Everyone born from [birth year] through 1964. Anyone with known risk factors for hepatitis C. Sexually transmitted infections (STIs) Get screened for STIs, including gonorrhea and chlamydia, if: You are sexually active and are younger than 27 years of age. You are older than 28 years of age and your health care provider tells you that you are at risk for this type of infection. Your sexual activity has changed since you were last screened, and you are at increased risk for chlamydia or gonorrhea. Ask your health care provider if you are at risk. Ask your health care provider about whether you are at high risk for HIV. Your health care provider may recommend a prescription medicine to help prevent HIV  infection. If you choose to take medicine to prevent HIV, you should first get tested for HIV. You should then be tested every 3 months for as long as you are taking the medicine. Pregnancy If you are about to stop having your period (premenopausal) and you may become pregnant, seek counseling before you get pregnant. Take 400 to 800  micrograms (mcg) of folic acid every day if you become pregnant. Ask for birth control (contraception) if you want to prevent pregnancy. Osteoporosis and menopause Osteoporosis is a disease in which the bones lose minerals and strength with aging. This can result in bone fractures. If you are 29 years old or older, or if you are at risk for osteoporosis and fractures, ask your health care provider if you should: Be screened for bone loss. Take a calcium or vitamin D supplement to lower your risk of fractures. Be given hormone replacement therapy (HRT) to treat symptoms of menopause. Follow these instructions at home: Alcohol use Do not drink alcohol if: Your health care provider tells you not to drink. You are pregnant, may be pregnant, or are planning to become pregnant. If you drink alcohol: Limit how much you have to: 0-1 drink a day. Know how much alcohol is in your drink. In the [place] one drink equals one 12 oz bottle of beer (355 mL), one 5 oz glass of wine (148 mL), or one 1 oz glass of hard liquor (44 mL). Lifestyle Do not use any products that contain nicotine or tobacco. These products include cigarettes, chewing tobacco, and vaping devices, such as e-cigarettes. If you need help quitting, ask your health care provider. Do not use street drugs. Do not share needles. Ask your health care provider for help if you need support or information about quitting drugs. General instructions Schedule regular health, dental, and eye exams. Stay current with your vaccines. Tell your health care provider if: You often feel depressed. You have ever been abused or do not feel safe at home. Summary Adopting a healthy lifestyle and getting preventive care are important in promoting health and wellness. Follow your health care provider's instructions about healthy diet, exercising, and getting tested or screened for diseases. Follow your health care provider's instructions on monitoring your  cholesterol and blood pressure. This information is not intended to replace advice given to you by your health care provider. Make sure you discuss any questions you have with your health care provider. Document Revised: 06/26/2020 Document Reviewed: 06/26/2020 Elsevier Patient Education  2024 [employer]Nexplanon  Instructions After Removal Keep bandage clean and dry for 24 hours  May use ice/Tylenol /Ibuprofen  for soreness or pain  If you develop fever, drainage or increased warmth from incision site-contact office immediately

## 2023-11-27 NOTE — Progress Notes (Signed)
    GYNECOLOGY PROCEDURE NOTE  Nexplanon  removal discussed in detail.  Risks of infection, bleeding, nerve injury all reviewed.  Patient understands risks and desires to proceed.  Verbal consent obtained.  Patient is certain she wants the Nexplanon  removed.  All questions answered.   Review of Systems  Constitutional:  Negative for chills and fever.  HENT:  Negative for congestion, ear discharge, ear pain, hearing loss, sinus pain and sore throat.   Eyes:  Negative for blurred vision and double vision.  Respiratory:  Negative for cough, shortness of breath and wheezing.   Cardiovascular:  Negative for chest pain, palpitations and leg swelling.  Gastrointestinal:  Negative for abdominal pain, blood in stool, constipation, diarrhea, heartburn, melena, nausea and vomiting.  Genitourinary:  Negative for dysuria, flank pain, frequency, hematuria and urgency.  Musculoskeletal:  Negative for back pain, joint pain and myalgias.  Skin:  Negative for itching and rash.  Neurological:  Negative for dizziness, tingling, tremors, sensory change, speech change, focal weakness, seizures, loss of consciousness, weakness and headaches.  Endo/Heme/Allergies:  Negative for environmental allergies. Does not bruise/bleed easily.  Psychiatric/Behavioral:  Negative for depression, hallucinations, memory loss, substance abuse and suicidal ideas. The patient is not nervous/anxious and does not have insomnia.     Vital Signs: BP 108/67   Pulse 89   Ht 5' 2 (1.575 m)   Wt 150 lb 4.8 oz (68.2 kg)   LMP 11/15/2023 (Approximate)   BMI 27.49 kg/m  Constitutional: Well nourished, well developed female in no acute distress.  Skin: Warm and dry.  Cardiovascular: Regular rate and rhythm.   Extremity: no edema  Respiratory:  Normal respiratory effort Psych: Alert and Oriented x3. No memory deficits. Normal mood and affect.    Procedure: Patient placed in dorsal supine with left arm above head, elbow flexed at 90  degrees, arm resting on examination table.  Nexplanon  identified without problems.  Site prepped with Chlorhexadine.  1 ml of 1% lidocaine  injected under Nexplanon  device without problems.  Sterile gloves applied.  Small 0.5cm incision made at distal tip of Nexplanon  device with 11 blade scalpel.  Nexplanon  brought to incision and grasped with a small kelly clamp.  Nexplanon  removed intact without problems.  Pressure applied to incision.  Hemostasis obtained.  Steri-strips applied, followed by bandage and compression dressing.  Patient tolerated procedure well.  No complications.   Assessment: 28 y.o. year old female now s/p uncomplicated Nexplanon  removal.  Plan: 1.  Patient given post procedure precautions and asked to call for fever, chills, redness or drainage from her incision, bleeding from incision.  She understands she will likely have a small bruise near site of removal and can remove bandage tomorrow and steri-strips in approximately 1 week.  2) Contraception: condoms. No current plans for conception.  3) Due for annual with PAP smear   Slater Rains, CNM [facility] 11/27/2023 10:31 AM   J2001 for lidocaine  block, 88017 for nexplanon  removal

## 2024-01-05 ENCOUNTER — Ambulatory Visit: Payer: Self-pay | Admitting: Advanced Practice Midwife
